# Patient Record
Sex: Female | Born: 1945 | Race: White | Hispanic: No | Smoking: Former smoker
Health system: Southern US, Community
[De-identification: ages and names within clinical notes are randomized; demographics above are authoritative.]

## PROBLEM LIST (undated history)

## (undated) DIAGNOSIS — J449 Chronic obstructive pulmonary disease, unspecified: Secondary | ICD-10-CM

## (undated) DIAGNOSIS — E063 Autoimmune thyroiditis: Secondary | ICD-10-CM

## (undated) DIAGNOSIS — I471 Supraventricular tachycardia, unspecified: Secondary | ICD-10-CM

## (undated) DIAGNOSIS — G459 Transient cerebral ischemic attack, unspecified: Secondary | ICD-10-CM

## (undated) DIAGNOSIS — Z5189 Encounter for other specified aftercare: Secondary | ICD-10-CM

## (undated) DIAGNOSIS — D849 Immunodeficiency, unspecified: Secondary | ICD-10-CM

## (undated) DIAGNOSIS — J189 Pneumonia, unspecified organism: Secondary | ICD-10-CM

## (undated) DIAGNOSIS — M1990 Unspecified osteoarthritis, unspecified site: Secondary | ICD-10-CM

## (undated) HISTORY — PX: CHOLECYSTECTOMY: SHX55

## (undated) HISTORY — PX: TONSILLECTOMY: SUR1361

## (undated) HISTORY — PX: ABDOMINAL HYSTERECTOMY: SHX81

## (undated) HISTORY — DX: Supraventricular tachycardia, unspecified: I47.10

## (undated) HISTORY — DX: Supraventricular tachycardia: I47.1

## (undated) HISTORY — PX: APPENDECTOMY: SHX54

## (undated) HISTORY — PX: CATARACT EXTRACTION: SUR2

---

## 2012-02-03 DIAGNOSIS — R9389 Abnormal findings on diagnostic imaging of other specified body structures: Secondary | ICD-10-CM

## 2012-02-03 DIAGNOSIS — R0602 Shortness of breath: Secondary | ICD-10-CM

## 2012-02-03 HISTORY — DX: Abnormal findings on diagnostic imaging of other specified body structures: R93.89

## 2012-02-03 HISTORY — DX: Shortness of breath: R06.02

## 2012-09-08 DIAGNOSIS — I341 Nonrheumatic mitral (valve) prolapse: Secondary | ICD-10-CM | POA: Insufficient documentation

## 2012-09-08 DIAGNOSIS — G459 Transient cerebral ischemic attack, unspecified: Secondary | ICD-10-CM

## 2012-09-08 DIAGNOSIS — G43119 Migraine with aura, intractable, without status migrainosus: Secondary | ICD-10-CM | POA: Insufficient documentation

## 2012-09-08 DIAGNOSIS — A310 Pulmonary mycobacterial infection: Secondary | ICD-10-CM

## 2012-09-08 DIAGNOSIS — R413 Other amnesia: Secondary | ICD-10-CM

## 2012-09-08 HISTORY — DX: Transient cerebral ischemic attack, unspecified: G45.9

## 2012-09-08 HISTORY — DX: Migraine with aura, intractable, without status migrainosus: G43.119

## 2012-09-08 HISTORY — DX: Other amnesia: R41.3

## 2012-09-08 HISTORY — DX: Nonrheumatic mitral (valve) prolapse: I34.1

## 2012-09-08 HISTORY — DX: Pulmonary mycobacterial infection: A31.0

## 2013-11-30 DIAGNOSIS — A419 Sepsis, unspecified organism: Secondary | ICD-10-CM

## 2013-11-30 HISTORY — DX: Sepsis, unspecified organism: A41.9

## 2013-12-21 DIAGNOSIS — I679 Cerebrovascular disease, unspecified: Secondary | ICD-10-CM

## 2013-12-21 DIAGNOSIS — L659 Nonscarring hair loss, unspecified: Secondary | ICD-10-CM

## 2013-12-21 DIAGNOSIS — I6782 Cerebral ischemia: Secondary | ICD-10-CM | POA: Insufficient documentation

## 2013-12-21 DIAGNOSIS — R5381 Other malaise: Secondary | ICD-10-CM

## 2013-12-21 DIAGNOSIS — I519 Heart disease, unspecified: Secondary | ICD-10-CM

## 2013-12-21 DIAGNOSIS — Z79899 Other long term (current) drug therapy: Secondary | ICD-10-CM

## 2013-12-21 DIAGNOSIS — J90 Pleural effusion, not elsewhere classified: Secondary | ICD-10-CM | POA: Insufficient documentation

## 2013-12-21 DIAGNOSIS — M1710 Unilateral primary osteoarthritis, unspecified knee: Secondary | ICD-10-CM

## 2013-12-21 DIAGNOSIS — N951 Menopausal and female climacteric states: Secondary | ICD-10-CM

## 2013-12-21 DIAGNOSIS — F17200 Nicotine dependence, unspecified, uncomplicated: Secondary | ICD-10-CM

## 2013-12-21 DIAGNOSIS — I951 Orthostatic hypotension: Secondary | ICD-10-CM | POA: Insufficient documentation

## 2013-12-21 DIAGNOSIS — R0902 Hypoxemia: Secondary | ICD-10-CM | POA: Insufficient documentation

## 2013-12-21 DIAGNOSIS — F4320 Adjustment disorder, unspecified: Secondary | ICD-10-CM

## 2013-12-21 DIAGNOSIS — R519 Headache, unspecified: Secondary | ICD-10-CM

## 2013-12-21 DIAGNOSIS — K589 Irritable bowel syndrome without diarrhea: Secondary | ICD-10-CM

## 2013-12-21 DIAGNOSIS — J984 Other disorders of lung: Secondary | ICD-10-CM

## 2013-12-21 DIAGNOSIS — M2240 Chondromalacia patellae, unspecified knee: Secondary | ICD-10-CM

## 2013-12-21 DIAGNOSIS — I368 Other nonrheumatic tricuspid valve disorders: Secondary | ICD-10-CM

## 2013-12-21 DIAGNOSIS — E875 Hyperkalemia: Secondary | ICD-10-CM

## 2013-12-21 DIAGNOSIS — G5760 Lesion of plantar nerve, unspecified lower limb: Secondary | ICD-10-CM

## 2013-12-21 DIAGNOSIS — K219 Gastro-esophageal reflux disease without esophagitis: Secondary | ICD-10-CM

## 2013-12-21 DIAGNOSIS — E039 Hypothyroidism, unspecified: Secondary | ICD-10-CM

## 2013-12-21 DIAGNOSIS — F988 Other specified behavioral and emotional disorders with onset usually occurring in childhood and adolescence: Secondary | ICD-10-CM

## 2013-12-21 DIAGNOSIS — R404 Transient alteration of awareness: Secondary | ICD-10-CM

## 2013-12-21 DIAGNOSIS — M26629 Arthralgia of temporomandibular joint, unspecified side: Secondary | ICD-10-CM

## 2013-12-21 HISTORY — DX: Hypoxemia: R09.02

## 2013-12-21 HISTORY — DX: Nicotine dependence, unspecified, uncomplicated: F17.200

## 2013-12-21 HISTORY — DX: Other malaise: R53.81

## 2013-12-21 HISTORY — DX: Chondromalacia patellae, unspecified knee: M22.40

## 2013-12-21 HISTORY — DX: Heart disease, unspecified: I51.9

## 2013-12-21 HISTORY — DX: Other long term (current) drug therapy: Z79.899

## 2013-12-21 HISTORY — DX: Nonscarring hair loss, unspecified: L65.9

## 2013-12-21 HISTORY — DX: Lesion of plantar nerve, unspecified lower limb: G57.60

## 2013-12-21 HISTORY — DX: Arthralgia of temporomandibular joint, unspecified side: M26.629

## 2013-12-21 HISTORY — DX: Adjustment disorder, unspecified: F43.20

## 2013-12-21 HISTORY — DX: Cerebrovascular disease, unspecified: I67.9

## 2013-12-21 HISTORY — DX: Hyperkalemia: E87.5

## 2013-12-21 HISTORY — DX: Gastro-esophageal reflux disease without esophagitis: K21.9

## 2013-12-21 HISTORY — DX: Menopausal and female climacteric states: N95.1

## 2013-12-21 HISTORY — DX: Transient alteration of awareness: R40.4

## 2013-12-21 HISTORY — DX: Unilateral primary osteoarthritis, unspecified knee: M17.10

## 2013-12-21 HISTORY — DX: Pleural effusion, not elsewhere classified: J90

## 2013-12-21 HISTORY — DX: Cerebral ischemia: I67.82

## 2013-12-21 HISTORY — DX: Other disorders of lung: J98.4

## 2013-12-21 HISTORY — DX: Irritable bowel syndrome, unspecified: K58.9

## 2013-12-21 HISTORY — DX: Orthostatic hypotension: I95.1

## 2013-12-21 HISTORY — DX: Headache, unspecified: R51.9

## 2013-12-21 HISTORY — DX: Other nonrheumatic tricuspid valve disorders: I36.8

## 2013-12-21 HISTORY — DX: Hypothyroidism, unspecified: E03.9

## 2013-12-21 HISTORY — DX: Other specified behavioral and emotional disorders with onset usually occurring in childhood and adolescence: F98.8

## 2013-12-27 DIAGNOSIS — R918 Other nonspecific abnormal finding of lung field: Secondary | ICD-10-CM | POA: Insufficient documentation

## 2013-12-27 HISTORY — DX: Other nonspecific abnormal finding of lung field: R91.8

## 2013-12-29 DIAGNOSIS — D510 Vitamin B12 deficiency anemia due to intrinsic factor deficiency: Secondary | ICD-10-CM | POA: Insufficient documentation

## 2013-12-29 DIAGNOSIS — E876 Hypokalemia: Secondary | ICD-10-CM

## 2013-12-29 DIAGNOSIS — I4891 Unspecified atrial fibrillation: Secondary | ICD-10-CM

## 2013-12-29 DIAGNOSIS — I480 Paroxysmal atrial fibrillation: Secondary | ICD-10-CM | POA: Insufficient documentation

## 2013-12-29 HISTORY — DX: Vitamin B12 deficiency anemia due to intrinsic factor deficiency: D51.0

## 2013-12-29 HISTORY — DX: Unspecified atrial fibrillation: I48.91

## 2013-12-29 HISTORY — DX: Hypokalemia: E87.6

## 2013-12-29 HISTORY — DX: Paroxysmal atrial fibrillation: I48.0

## 2014-02-02 DIAGNOSIS — R600 Localized edema: Secondary | ICD-10-CM | POA: Insufficient documentation

## 2014-02-02 HISTORY — DX: Localized edema: R60.0

## 2014-02-14 DIAGNOSIS — M722 Plantar fascial fibromatosis: Secondary | ICD-10-CM

## 2014-02-14 DIAGNOSIS — M19079 Primary osteoarthritis, unspecified ankle and foot: Secondary | ICD-10-CM | POA: Insufficient documentation

## 2014-02-14 HISTORY — DX: Plantar fascial fibromatosis: M72.2

## 2014-02-14 HISTORY — DX: Primary osteoarthritis, unspecified ankle and foot: M19.079

## 2014-06-05 DIAGNOSIS — J45901 Unspecified asthma with (acute) exacerbation: Secondary | ICD-10-CM | POA: Insufficient documentation

## 2014-06-05 HISTORY — DX: Unspecified asthma with (acute) exacerbation: J45.901

## 2014-06-30 DIAGNOSIS — I8002 Phlebitis and thrombophlebitis of superficial vessels of left lower extremity: Secondary | ICD-10-CM

## 2014-06-30 HISTORY — DX: Phlebitis and thrombophlebitis of superficial vessels of left lower extremity: I80.02

## 2014-08-15 DIAGNOSIS — R0682 Tachypnea, not elsewhere classified: Secondary | ICD-10-CM

## 2014-08-15 HISTORY — DX: Tachypnea, not elsewhere classified: R06.82

## 2014-08-28 DIAGNOSIS — S8002XA Contusion of left knee, initial encounter: Secondary | ICD-10-CM | POA: Insufficient documentation

## 2014-08-28 HISTORY — DX: Contusion of left knee, initial encounter: S80.02XA

## 2014-09-26 DIAGNOSIS — L02531 Carbuncle of right hand: Secondary | ICD-10-CM

## 2014-09-26 HISTORY — DX: Carbuncle of right hand: L02.531

## 2014-11-01 DIAGNOSIS — Z90710 Acquired absence of both cervix and uterus: Secondary | ICD-10-CM | POA: Insufficient documentation

## 2014-11-01 HISTORY — DX: Acquired absence of both cervix and uterus: Z90.710

## 2014-11-22 DIAGNOSIS — J45909 Unspecified asthma, uncomplicated: Secondary | ICD-10-CM

## 2014-11-22 HISTORY — DX: Unspecified asthma, uncomplicated: J45.909

## 2014-11-29 DIAGNOSIS — D509 Iron deficiency anemia, unspecified: Secondary | ICD-10-CM | POA: Insufficient documentation

## 2014-11-29 HISTORY — DX: Iron deficiency anemia, unspecified: D50.9

## 2015-02-14 DIAGNOSIS — R42 Dizziness and giddiness: Secondary | ICD-10-CM

## 2015-02-14 DIAGNOSIS — R519 Headache, unspecified: Secondary | ICD-10-CM

## 2015-02-14 HISTORY — DX: Dizziness and giddiness: R42

## 2015-02-14 HISTORY — DX: Headache, unspecified: R51.9

## 2015-02-28 DIAGNOSIS — J189 Pneumonia, unspecified organism: Secondary | ICD-10-CM

## 2015-02-28 DIAGNOSIS — S46919A Strain of unspecified muscle, fascia and tendon at shoulder and upper arm level, unspecified arm, initial encounter: Secondary | ICD-10-CM | POA: Insufficient documentation

## 2015-02-28 HISTORY — DX: Pneumonia, unspecified organism: J18.9

## 2015-02-28 HISTORY — DX: Strain of unspecified muscle, fascia and tendon at shoulder and upper arm level, unspecified arm, initial encounter: S46.919A

## 2015-03-02 DIAGNOSIS — D801 Nonfamilial hypogammaglobulinemia: Secondary | ICD-10-CM

## 2015-03-02 HISTORY — DX: Nonfamilial hypogammaglobulinemia: D80.1

## 2015-03-14 DIAGNOSIS — D839 Common variable immunodeficiency, unspecified: Secondary | ICD-10-CM

## 2015-03-14 HISTORY — DX: Common variable immunodeficiency, unspecified: D83.9

## 2015-03-28 DIAGNOSIS — R634 Abnormal weight loss: Secondary | ICD-10-CM

## 2015-03-28 DIAGNOSIS — R112 Nausea with vomiting, unspecified: Secondary | ICD-10-CM | POA: Insufficient documentation

## 2015-03-28 HISTORY — DX: Nausea with vomiting, unspecified: R11.2

## 2015-03-28 HISTORY — DX: Abnormal weight loss: R63.4

## 2015-04-25 DIAGNOSIS — D806 Antibody deficiency with near-normal immunoglobulins or with hyperimmunoglobulinemia: Secondary | ICD-10-CM

## 2015-04-25 HISTORY — DX: Antibody deficiency with near-normal immunoglobulins or with hyperimmunoglobulinemia: D80.6

## 2015-06-17 ENCOUNTER — Encounter (HOSPITAL_BASED_OUTPATIENT_CLINIC_OR_DEPARTMENT_OTHER): Payer: Self-pay | Admitting: *Deleted

## 2015-06-17 ENCOUNTER — Emergency Department (HOSPITAL_BASED_OUTPATIENT_CLINIC_OR_DEPARTMENT_OTHER): Payer: Medicare Other

## 2015-06-17 ENCOUNTER — Emergency Department (HOSPITAL_BASED_OUTPATIENT_CLINIC_OR_DEPARTMENT_OTHER)
Admission: EM | Admit: 2015-06-17 | Discharge: 2015-06-17 | Disposition: A | Discharge status: Home / Self Care | Payer: Medicare Other | Attending: Emergency Medicine | Admitting: Emergency Medicine

## 2015-06-17 DIAGNOSIS — S80211A Abrasion, right knee, initial encounter: Secondary | ICD-10-CM | POA: Insufficient documentation

## 2015-06-17 DIAGNOSIS — Y998 Other external cause status: Secondary | ICD-10-CM | POA: Diagnosis not present

## 2015-06-17 DIAGNOSIS — S0990XA Unspecified injury of head, initial encounter: Secondary | ICD-10-CM | POA: Diagnosis present

## 2015-06-17 DIAGNOSIS — M1990 Unspecified osteoarthritis, unspecified site: Secondary | ICD-10-CM | POA: Insufficient documentation

## 2015-06-17 DIAGNOSIS — Z8673 Personal history of transient ischemic attack (TIA), and cerebral infarction without residual deficits: Secondary | ICD-10-CM | POA: Diagnosis not present

## 2015-06-17 DIAGNOSIS — W01198A Fall on same level from slipping, tripping and stumbling with subsequent striking against other object, initial encounter: Secondary | ICD-10-CM | POA: Insufficient documentation

## 2015-06-17 DIAGNOSIS — S60032A Contusion of left middle finger without damage to nail, initial encounter: Secondary | ICD-10-CM | POA: Insufficient documentation

## 2015-06-17 DIAGNOSIS — S60021A Contusion of right index finger without damage to nail, initial encounter: Secondary | ICD-10-CM | POA: Insufficient documentation

## 2015-06-17 DIAGNOSIS — Z8701 Personal history of pneumonia (recurrent): Secondary | ICD-10-CM | POA: Insufficient documentation

## 2015-06-17 DIAGNOSIS — Z79899 Other long term (current) drug therapy: Secondary | ICD-10-CM | POA: Insufficient documentation

## 2015-06-17 DIAGNOSIS — S0181XA Laceration without foreign body of other part of head, initial encounter: Secondary | ICD-10-CM | POA: Insufficient documentation

## 2015-06-17 DIAGNOSIS — S80212A Abrasion, left knee, initial encounter: Secondary | ICD-10-CM | POA: Insufficient documentation

## 2015-06-17 DIAGNOSIS — J449 Chronic obstructive pulmonary disease, unspecified: Secondary | ICD-10-CM | POA: Diagnosis not present

## 2015-06-17 DIAGNOSIS — Z87891 Personal history of nicotine dependence: Secondary | ICD-10-CM | POA: Diagnosis not present

## 2015-06-17 DIAGNOSIS — Y9301 Activity, walking, marching and hiking: Secondary | ICD-10-CM | POA: Diagnosis not present

## 2015-06-17 DIAGNOSIS — I4891 Unspecified atrial fibrillation: Secondary | ICD-10-CM | POA: Diagnosis not present

## 2015-06-17 DIAGNOSIS — Y92481 Parking lot as the place of occurrence of the external cause: Secondary | ICD-10-CM | POA: Diagnosis not present

## 2015-06-17 DIAGNOSIS — W19XXXA Unspecified fall, initial encounter: Secondary | ICD-10-CM

## 2015-06-17 HISTORY — DX: Encounter for other specified aftercare: Z51.89

## 2015-06-17 HISTORY — DX: Pneumonia, unspecified organism: J18.9

## 2015-06-17 HISTORY — DX: Unspecified osteoarthritis, unspecified site: M19.90

## 2015-06-17 HISTORY — DX: Chronic obstructive pulmonary disease, unspecified: J44.9

## 2015-06-17 HISTORY — DX: Autoimmune thyroiditis: E06.3

## 2015-06-17 HISTORY — DX: Immunodeficiency, unspecified: D84.9

## 2015-06-17 HISTORY — DX: Transient cerebral ischemic attack, unspecified: G45.9

## 2015-06-17 MED ORDER — ACETAMINOPHEN 325 MG PO TABS
650.0000 mg | ORAL_TABLET | Freq: Once | ORAL | Status: AC
  Administered 2015-06-17: 650 mg via ORAL
  Filled 2015-06-17: qty 2

## 2015-06-17 NOTE — ED Notes (Signed)
Pt able to ambulate around nurses station without difficulty - steady gait. EDP made aware.

## 2015-06-17 NOTE — ED Notes (Addendum)
Pt's daughter concern that pr might have head injury due to previous TIAs. Pt reports pian in the back of her head. Pt reports that she had her hands full with books , tripped over the concret bumper, landed on rt knee and hands. Pt's daughter reports that pt hit her head.  Laceration to rt knee , swollen index finger to lt hand over the knuckle, small laceration on the inner aspect of middle finger and small less than 1 cm laceration to lower chin. Daughter placed Tegaderm over chin and rt knee abrasion. Tegaderm removed on both areas. All areas cleaned with sterile saline, no active bleeding at present time. Wet to dry 4x4 placed over rt knee and around lt hand middle finger. Warm blankets given to patient and Dr. Winfred Leeds made aware of pt's current conditions and daughter's concerns.

## 2015-06-17 NOTE — ED Notes (Signed)
Reports she tripped in parking lot and fell- hit forehead and chin (chin is bleeding); bruising and abrasions noted to both knees and both hands. Denies LOC

## 2015-06-17 NOTE — ED Provider Notes (Addendum)
CSN: [id number]     Arrival date & time 06/17/15  1303 History   First MD Initiated Contact with Patient 06/17/15 1340     Chief Complaint  Patient presents with  . Fall     (Consider location/radiation/quality/duration/timing/severity/associated sxs/prior Treatment) HPI Patient was feeling well until she tripped and fell while walking today at 11:50 AM. She struck her face. She also struck both hands and both knees as result of fall. She complains of pain at her chin, occipital headache bilateral hand pain and bilateral knee pain since the event. She was able to walk after the event. She did not lose consciousness. No other associated symptoms. No treatment prior to coming here. Also suffered laceration to chin is resolving event. Pain is mild at present. Pain in hands are worse with movement or pressing on the area. Improved with remaining still. Past Medical History  Diagnosis Date  . A-fib ([place])   . Immune deficiency disorder ([place])   . Pneumonia   . Hashimoto's disease   . Blood transfusion without reported diagnosis   . Arthritis   . COPD (chronic obstructive pulmonary disease) ([place])   . TIA (transient ischemic attack)    Past Surgical History  Procedure Laterality Date  . Abdominal hysterectomy    . Tonsillectomy    . Cholecystectomy    . Appendectomy    . Cataract extraction Bilateral    thyroidectomy No family history on file. Social History  Substance Use Topics  . Smoking status: Former Research scientist (life sciences)  . Smokeless tobacco: Never Used  . Alcohol Use: No   OB History    No data available     Review of Systems  Constitutional: Negative.   Respiratory: Negative.   Cardiovascular: Negative.   Gastrointestinal: Negative.   Musculoskeletal: Positive for arthralgias.       Bilateral hand pain, bilateral knee pain  Skin: Positive for wound.       Laceration to chin abrasions to both knees  Allergic/Immunologic:       Up to date on tetanus immunization  Neurological:  Positive for headaches.       Memory problems since prior TIAs  Psychiatric/Behavioral: Negative.   All other systems reviewed and are negative.     Allergies  Ciprofloxacin; Macrolides and ketolides; and Sulfa antibiotics  Home Medications   Prior to Admission medications   Medication Sig Start Date End Date Taking? Authorizing Provider  ALPRAZolam Duanne Moron) 1 MG tablet Take 1 mg by mouth as needed for anxiety.   Yes Historical Provider, MD  cefUROXime (CEFTIN) 250 MG tablet Take 250 mg by mouth 2 (two) times daily with a meal.   Yes Historical Provider, MD  diltiazem (DILACOR XR) 180 MG 24 hr capsule Take 180 mg by mouth daily.   Yes Historical Provider, MD  estradiol (ESTRACE) 0.5 MG tablet Take 0.5 mg by mouth daily.   Yes Historical Provider, MD  HYDROcodone-acetaminophen (NORCO) 10-325 MG tablet Take 1 tablet by mouth every 6 (six) hours as needed (PRN for headaches).   Yes Historical Provider, MD  levothyroxine (SYNTHROID, LEVOTHROID) 125 MCG tablet Take 125 mcg by mouth daily before breakfast.   Yes Historical Provider, MD   BP 171/76 mmHg  Pulse 76  Temp(Src) 98.3 F (36.8 C) (Oral)  Resp 20  Ht 5' 7.5" (1.715 m)  Wt 125 lb (56.7 kg)  BMI 19.28 kg/m2  SpO2 97% Physical Exam  Constitutional: She is oriented to person, place, and time. She appears well-developed and well-nourished. No  distress.  Alert Glasgow Coma Score 15  HENT:  1 cm laceration at chin. No active bleeding no hematoma no trismus no bony tenderness no malocclusion teeth. Bilateral tympanic membranes normal otherwise normocephalic atraumatic  Eyes: Conjunctivae are normal. Pupils are equal, round, and reactive to light.  Neck: Neck supple. No JVD present. No tracheal deviation present. No thyromegaly present.  Surgical scar anteriorly. Mild diffuse tenderness posteriorly no bruit no JVD  Cardiovascular: Normal rate.   No murmur heard. Pulmonary/Chest: Effort normal and breath sounds normal. She exhibits  no tenderness.  Abdominal: Soft. Bowel sounds are normal. She exhibits no distension. There is no tenderness.  Musculoskeletal: Normal range of motion. She exhibits no edema or tenderness.  Pelvis stable nontender. Thoracic and lumbar spines nontender. Left upper extremity 3 cm hematoma over dorsal aspect of MCP joint middle finger with corresponding tenderness. Full range of motion otherwise atraumatic. Neurovascular intact. Right upper extremity 3 cm hematoma over dorsal aspect of MCP joint index finger with corresponding tenderness. Full range of motion. Neurovascular intact. Bilateral lower extremities with abrasions over her anterior knees with corresponding tenderness no obvious deformity. Neurovascularly intact.  Neurological: She is alert and oriented to person, place, and time. No cranial nerve deficit. Coordination normal.  Moves all extremity is well cranial nerves II through XII grossly intact motor strength 5 over 5 overall  Skin: Skin is warm and dry. No rash noted.  Psychiatric: She has a normal mood and affect.  Nursing note and vitals reviewed.   ED Course  Procedures (including critical care time) Labs Review Labs Reviewed - No data to display  Imaging Review No results found. I have personally reviewed and evaluated these images and lab results as part of my medical decision-making.   EKG Interpretation None     X-rays viewed by me  3:15 PM she is alert and ambulatory without difficulty. Posterior to go home after treatment with Tylenol MDM  CT scan of brain and cervical spine ordered due to mechanism of injury, headache and cervical spine tenderness in this elderly patient . Chin laceration does not require repair. Topical antibiotics applied to chin laceration and 2 abrasions of knees after local wound care.  Final diagnoses:  None  Plan local wound care home observation diagnosis #1 fall #3  1 cm chin laceration #4 abrasions to bilateral knees #67minor closed  head trauma #6 contusions to both hands     Orlie Dakin, MD 06/17/15 1621  Orlie Dakin, MD 06/17/15 1621

## 2015-06-17 NOTE — Discharge Instructions (Signed)
Wash wounds daily with soap and water and place a thin layer of bacitracin ointment over the wounds and cover with a sterile bandage. Signs of infection including redness around the wounds, drainage for the wounds, more pain or fever. Return for any signs of infection or concern. Take Tylenol for mild pain or your hydrocodone prescribed for bad pain. Don't take Tylenol together with hydrocodone as the combination can be dangerous to the liver. Don't take hydrocodone together with Xanax as the combination is dangerous and can interfere with breathing. Return if you feel worse for any reason or see her primary care physician.

## 2015-07-18 DIAGNOSIS — M7041 Prepatellar bursitis, right knee: Secondary | ICD-10-CM | POA: Insufficient documentation

## 2015-07-18 HISTORY — DX: Prepatellar bursitis, right knee: M70.41

## 2015-12-03 DIAGNOSIS — G4719 Other hypersomnia: Secondary | ICD-10-CM | POA: Insufficient documentation

## 2015-12-03 HISTORY — DX: Other hypersomnia: G47.19

## 2015-12-26 DIAGNOSIS — R091 Pleurisy: Secondary | ICD-10-CM

## 2015-12-26 HISTORY — DX: Pleurisy: R09.1

## 2016-01-02 DIAGNOSIS — J4530 Mild persistent asthma, uncomplicated: Secondary | ICD-10-CM

## 2016-01-02 HISTORY — DX: Mild persistent asthma, uncomplicated: J45.30

## 2016-03-20 DIAGNOSIS — R140 Abdominal distension (gaseous): Secondary | ICD-10-CM

## 2016-03-20 DIAGNOSIS — S8012XA Contusion of left lower leg, initial encounter: Secondary | ICD-10-CM | POA: Insufficient documentation

## 2016-03-20 DIAGNOSIS — I809 Phlebitis and thrombophlebitis of unspecified site: Secondary | ICD-10-CM

## 2016-03-20 DIAGNOSIS — K529 Noninfective gastroenteritis and colitis, unspecified: Secondary | ICD-10-CM | POA: Insufficient documentation

## 2016-03-20 DIAGNOSIS — J069 Acute upper respiratory infection, unspecified: Secondary | ICD-10-CM | POA: Insufficient documentation

## 2016-03-20 HISTORY — DX: Noninfective gastroenteritis and colitis, unspecified: K52.9

## 2016-03-20 HISTORY — DX: Contusion of left lower leg, initial encounter: S80.12XA

## 2016-03-20 HISTORY — DX: Phlebitis and thrombophlebitis of unspecified site: I80.9

## 2016-03-20 HISTORY — DX: Acute upper respiratory infection, unspecified: J06.9

## 2016-03-20 HISTORY — DX: Abdominal distension (gaseous): R14.0

## 2016-08-11 DIAGNOSIS — H6503 Acute serous otitis media, bilateral: Secondary | ICD-10-CM | POA: Insufficient documentation

## 2016-08-11 DIAGNOSIS — Z1159 Encounter for screening for other viral diseases: Secondary | ICD-10-CM | POA: Insufficient documentation

## 2016-08-11 HISTORY — DX: Acute serous otitis media, bilateral: H65.03

## 2016-08-11 HISTORY — DX: Encounter for screening for other viral diseases: Z11.59

## 2016-08-20 DIAGNOSIS — J208 Acute bronchitis due to other specified organisms: Secondary | ICD-10-CM

## 2016-08-20 DIAGNOSIS — M940 Chondrocostal junction syndrome [Tietze]: Secondary | ICD-10-CM

## 2016-08-20 HISTORY — DX: Acute bronchitis due to other specified organisms: J20.8

## 2016-08-20 HISTORY — DX: Chondrocostal junction syndrome (tietze): M94.0

## 2016-10-21 IMAGING — DX DG KNEE COMPLETE 4+V*R*
4 series · 4 of 4 positions shown · non-contrast
Comparison: None.

CLINICAL DATA: Status post fall on cement today with a right knee
injury. Pain. Initial encounter.

EXAM:
RIGHT KNEE - COMPLETE 4+ VIEW

[knee ap]
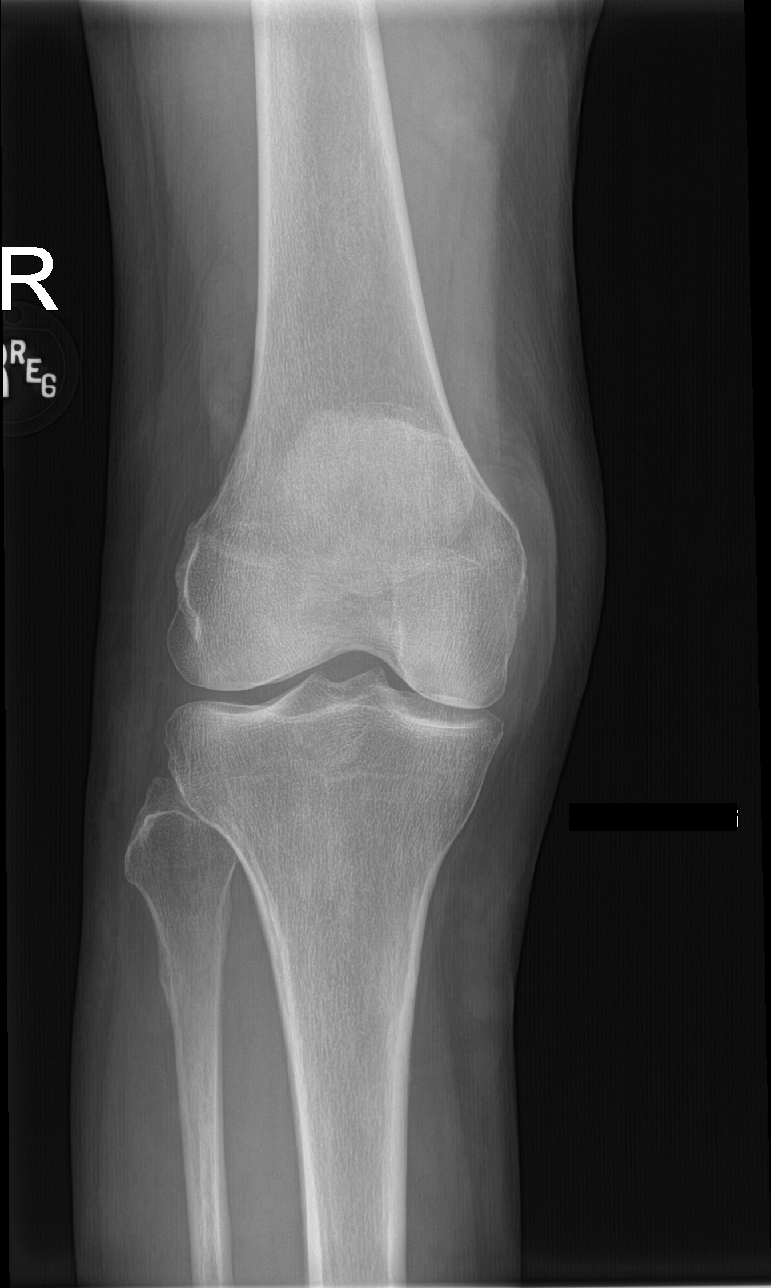

[tunnel]
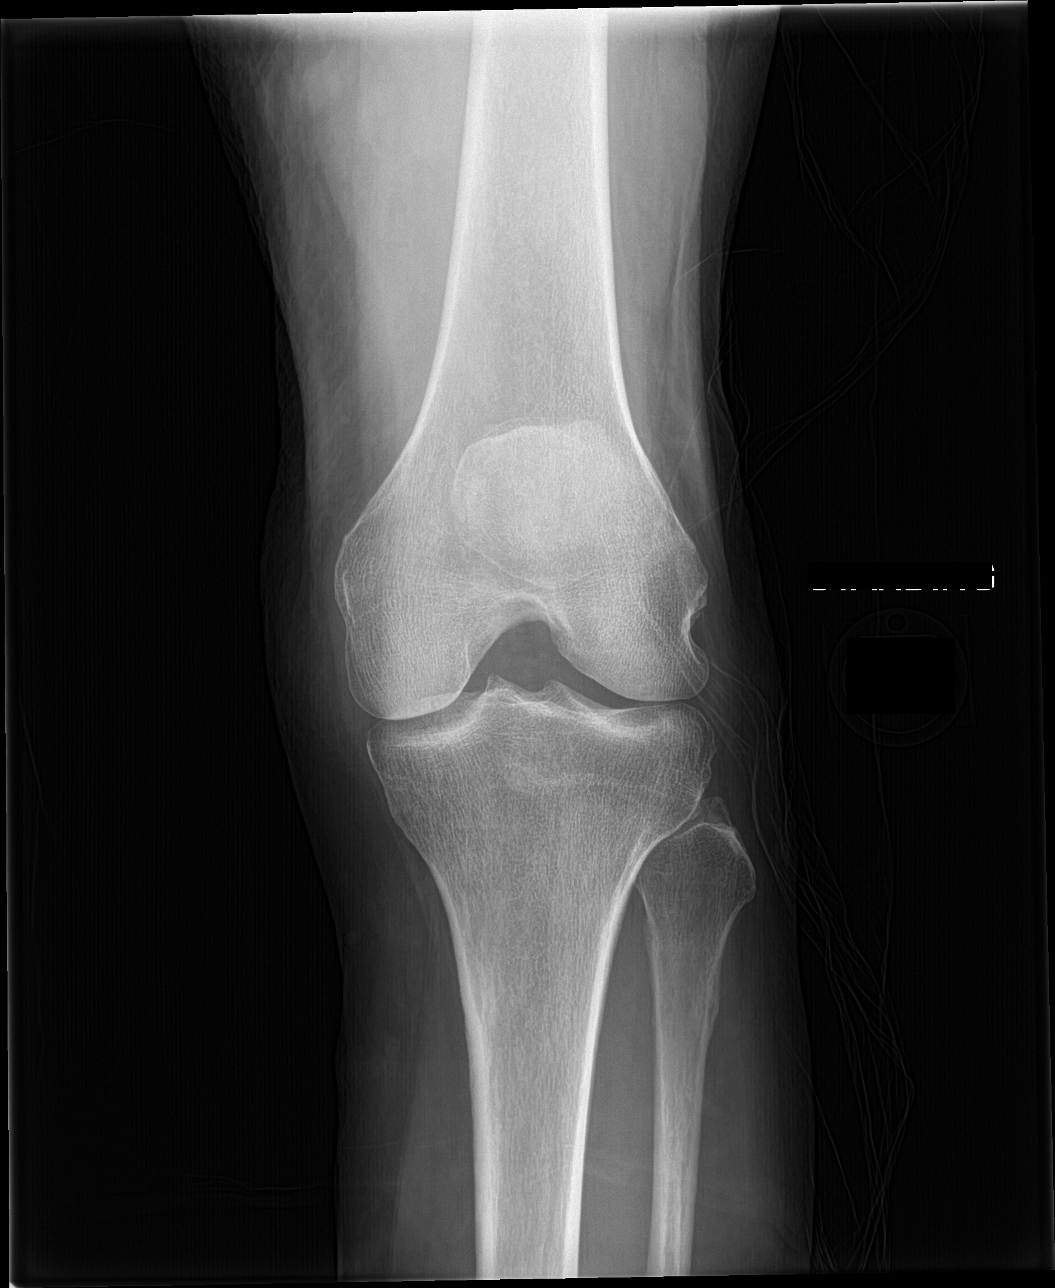

[knee lat]
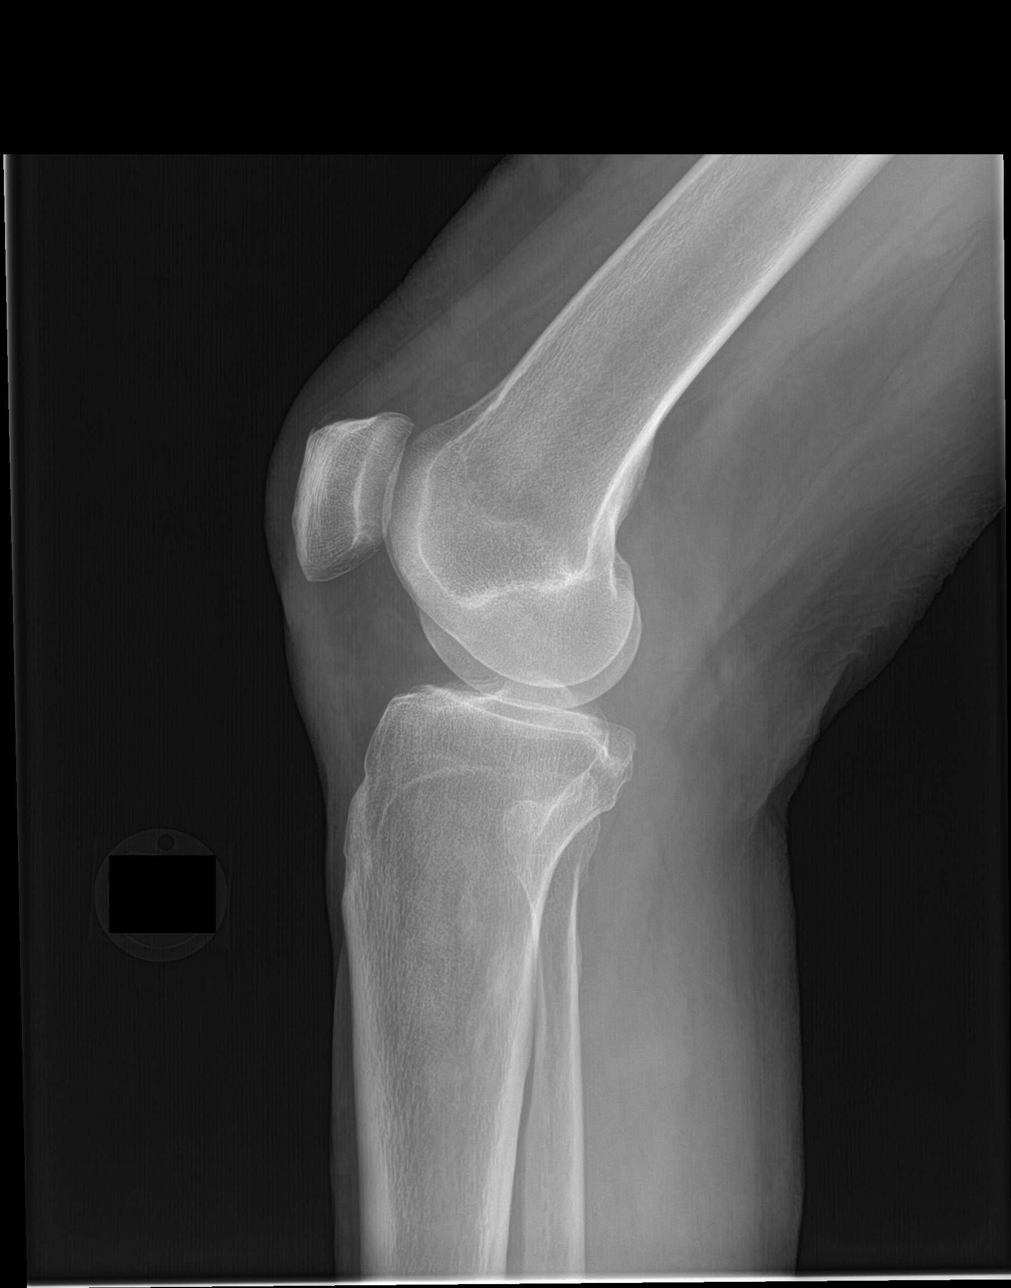

[knee sunrise]
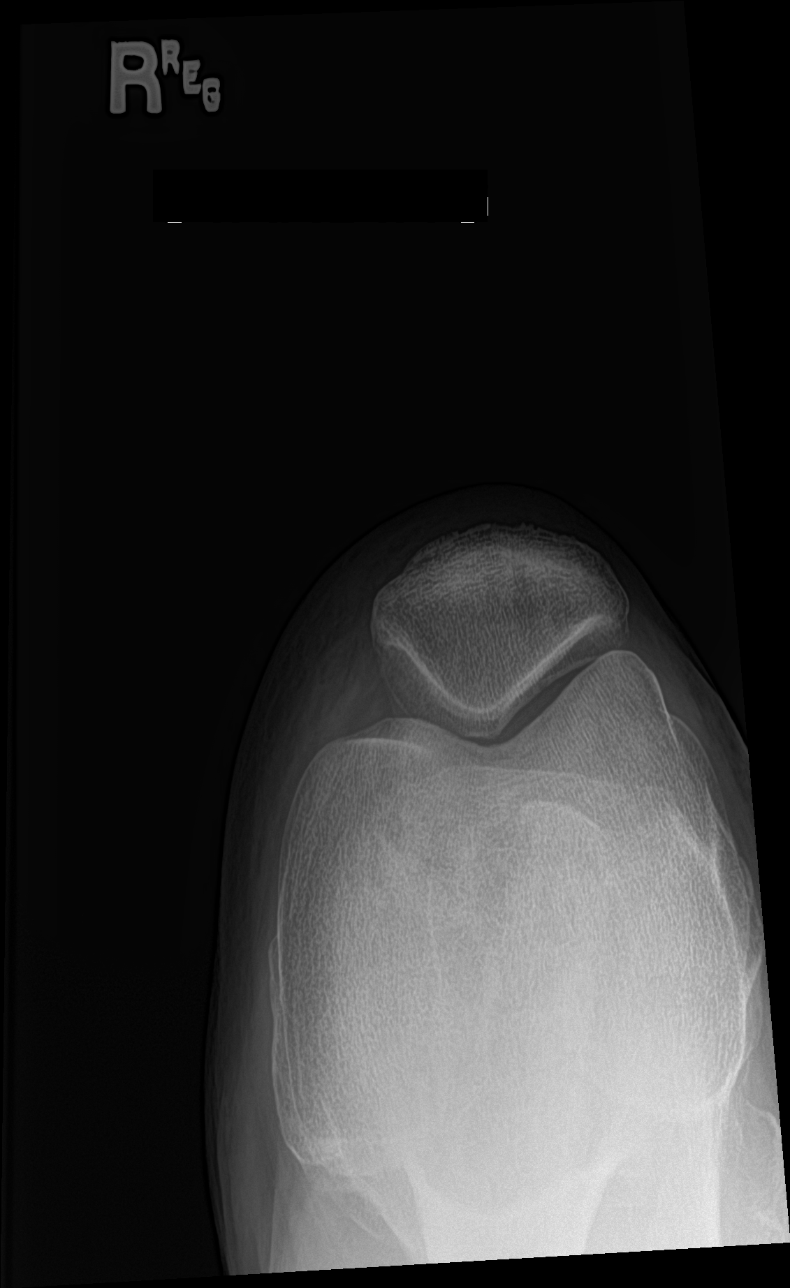

[4 of 4 positions shown; findings below may reference images not displayed]

FINDINGS: There is no evidence of fracture, dislocation, or joint effusion.
There is no evidence of arthropathy or other focal bone abnormality.
Soft tissues are unremarkable.
IMPRESSION: Negative exam.

## 2016-10-21 IMAGING — DX DG HAND COMPLETE 3+V*R*
3 series · 3 of 3 positions shown · non-contrast
Comparison: None.

CLINICAL DATA: Fall today onto cement. Posterior right third MCP
joint region abrasion. Initial encounter.

EXAM:
RIGHT HAND - COMPLETE 3+ VIEW

[hand pa]
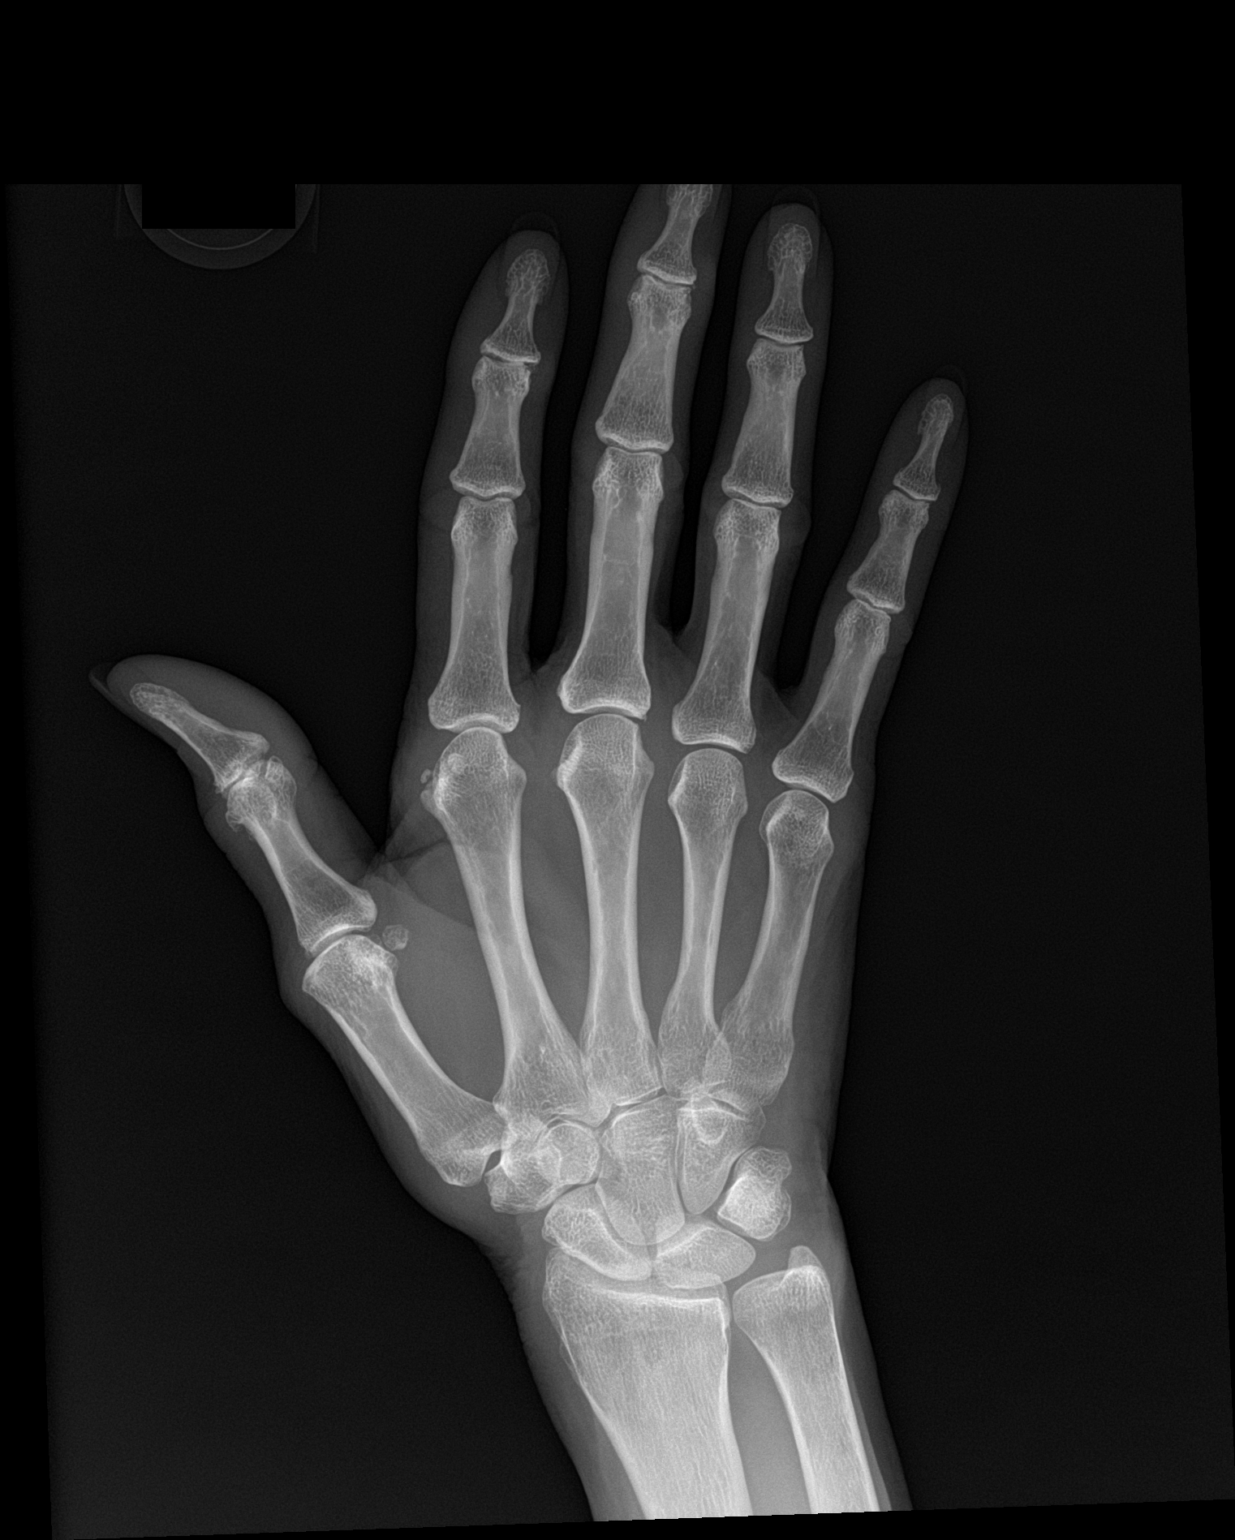

[hand obl]
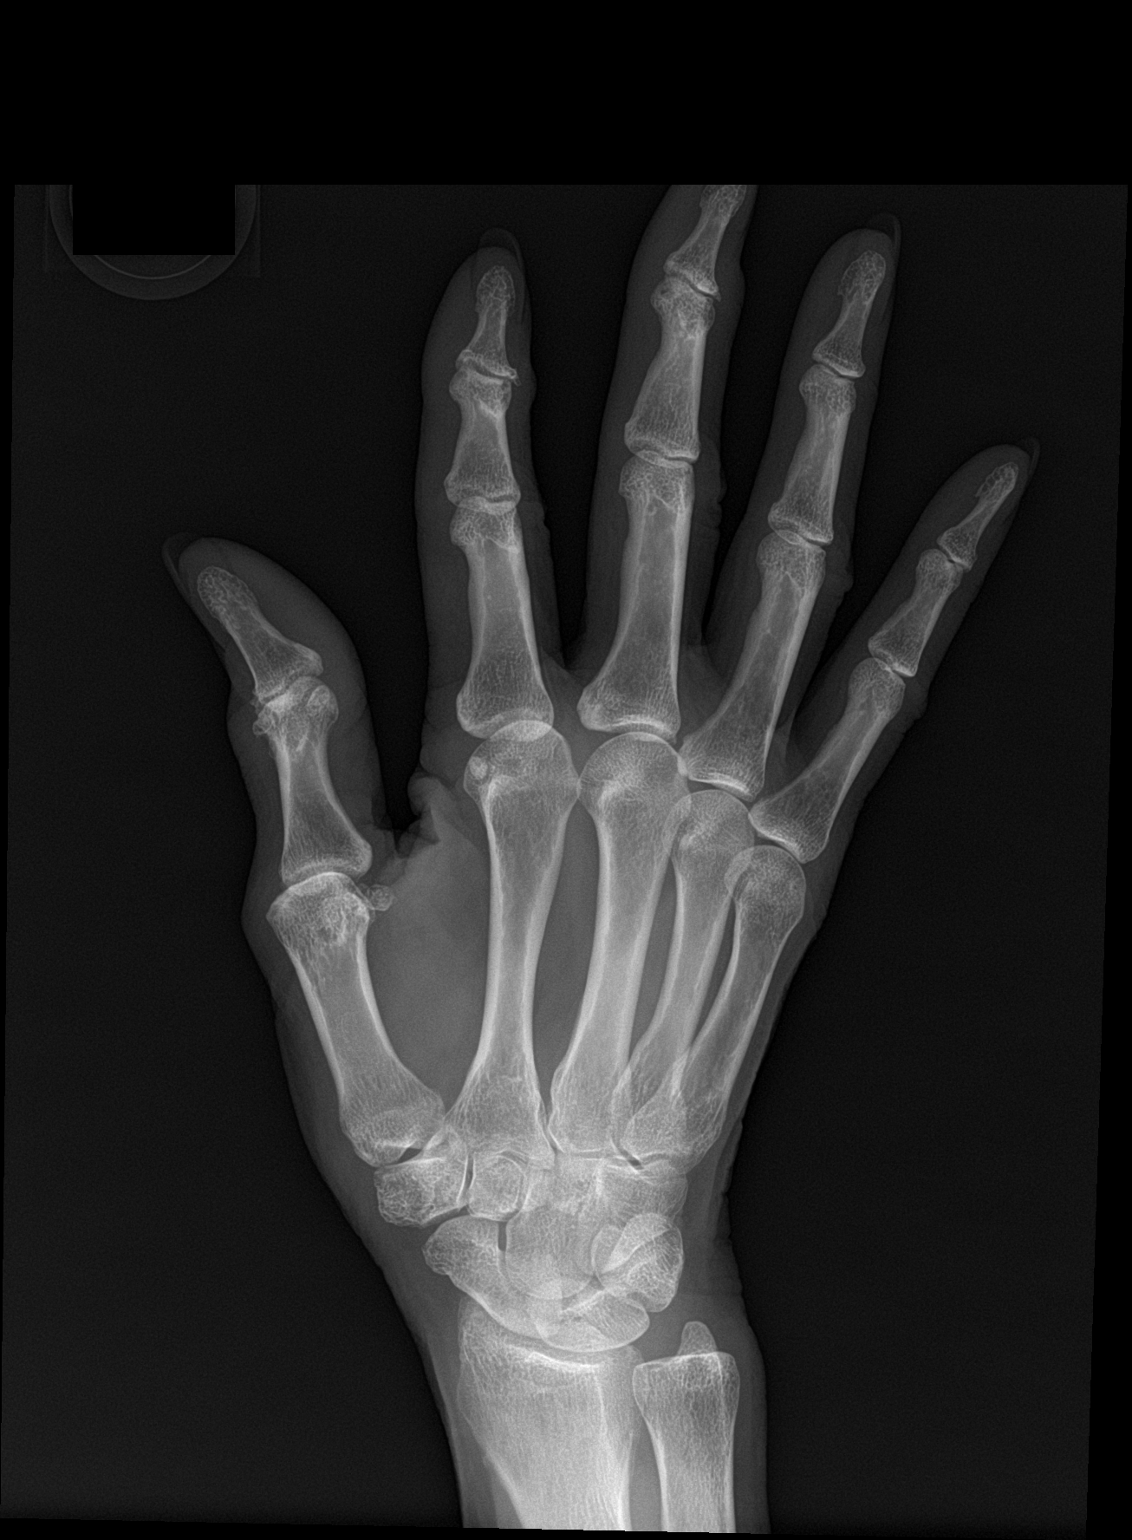

[hand lat]
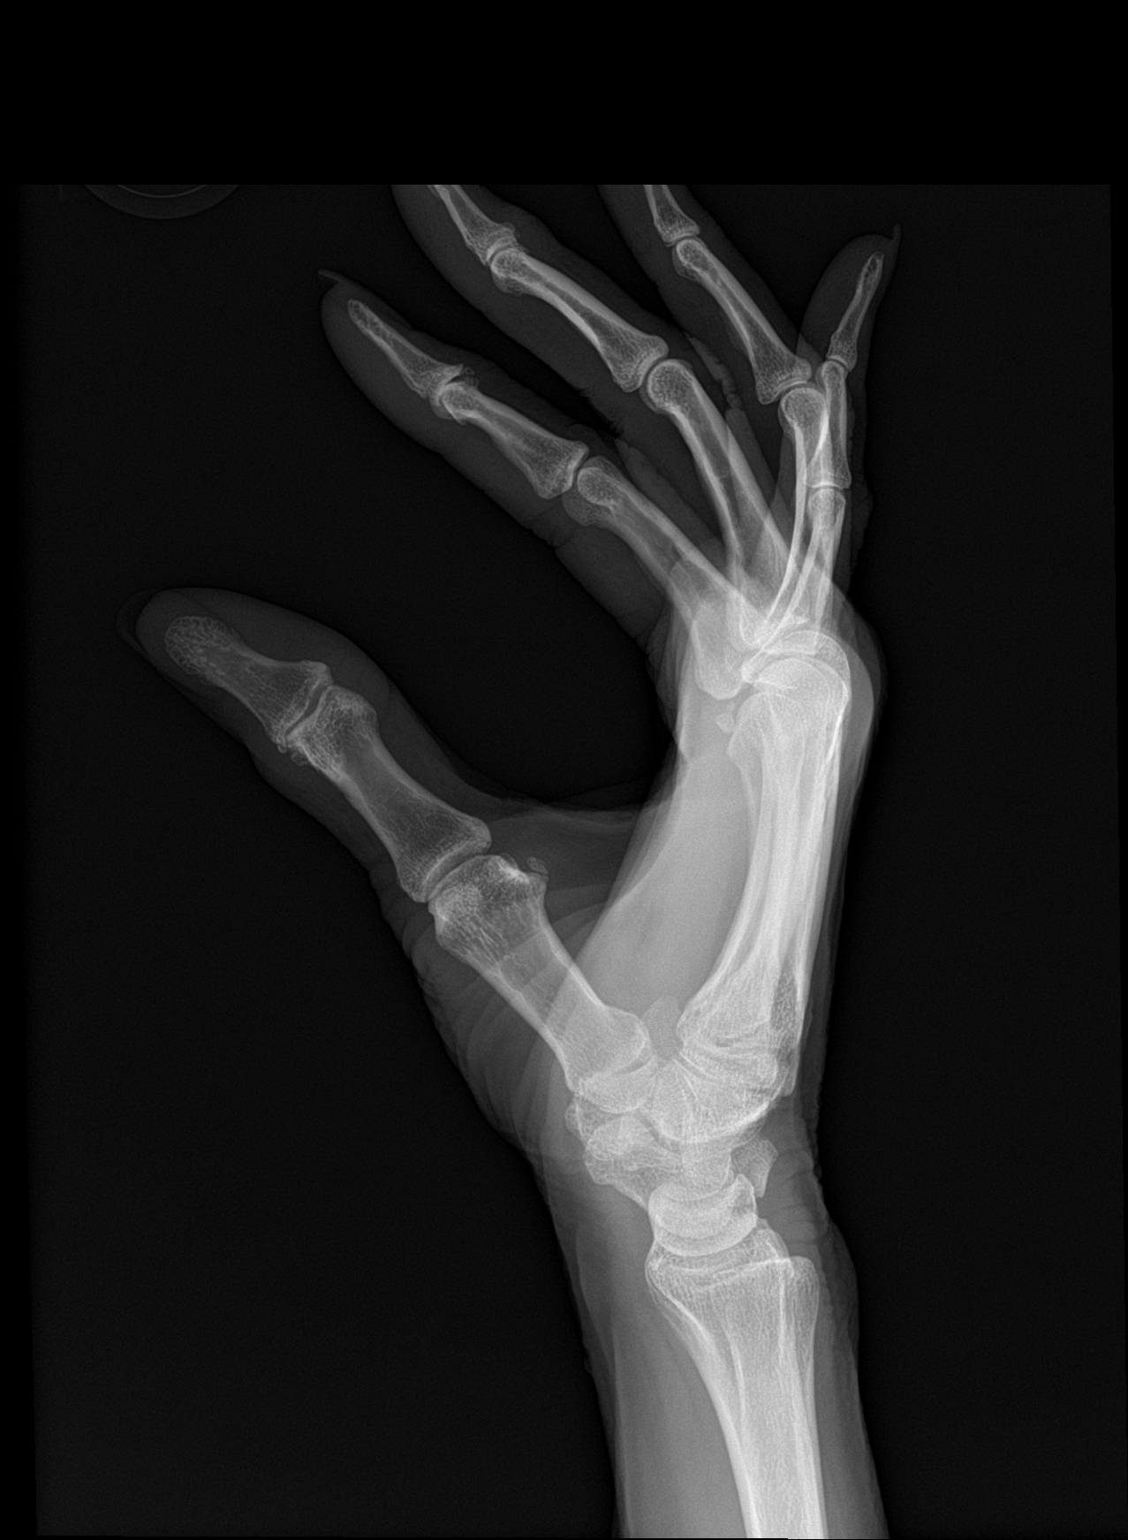

[3 of 3 positions shown; findings below may reference images not displayed]

FINDINGS: No acute fracture or dislocation is identified. DIP joint
degenerative changes are noted involving the index greater than long
fingers. Mild degenerative changes are also noted at the thumb IP
joint. No lytic or blastic osseous lesion is seen. No radiopaque
foreign body.
IMPRESSION: No acute osseous abnormality identified.

## 2016-10-21 IMAGING — CT CT CERVICAL SPINE W/O CM
4 of 6 series · 14 of 33 positions shown, 16 images · non-contrast
Comparison: None.

CLINICAL DATA: Patient status post trip and fall on concrete today.
Chin laceration, headache and neck pain. Initial encounter.

EXAM:
CT HEAD WITHOUT CONTRAST
CT CERVICAL SPINE WITHOUT CONTRAST
TECHNIQUE: Multidetector CT imaging of the head and cervical spine was
performed following the standard protocol without intravenous
contrast. Multiplanar CT image reconstructions of the cervical spine
were also generated.

[Series 5: c_spine 2.0 b41s st · axial · 0.33mm/px · z∈[-283,-191]mm · 3 of 93 slices shown, 4 images]
[im 24/93  soft-tissue]
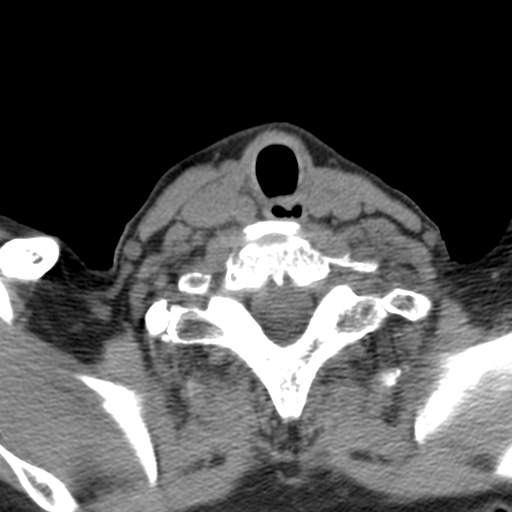
[im 24/93  bone]
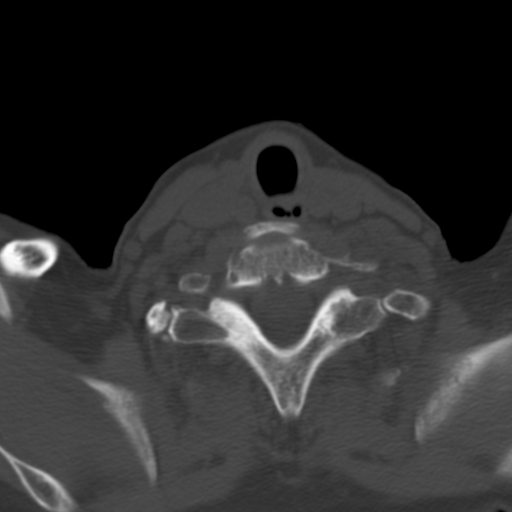
[im 47/93  bone]
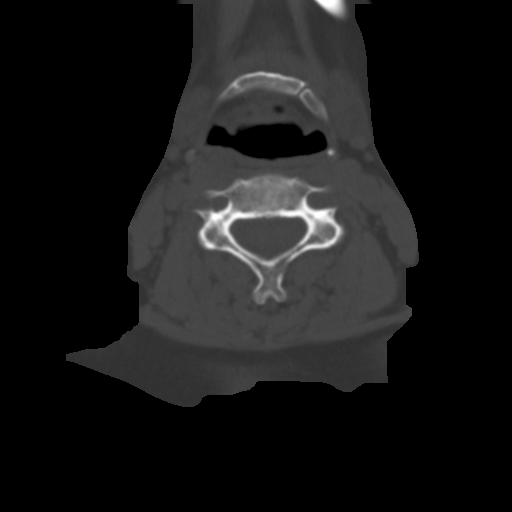
[im 70/93  bone]
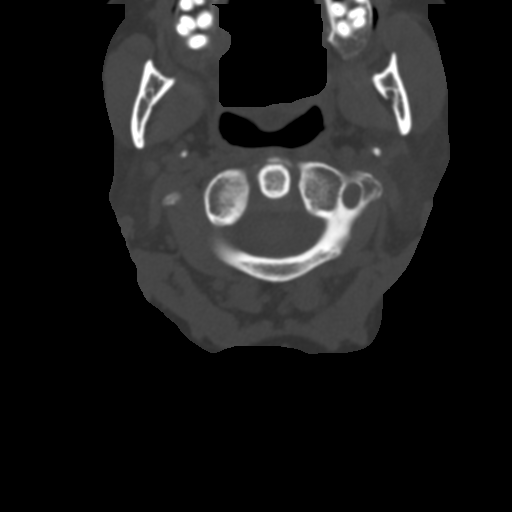

[Series 8: c_spine 2.0 coronal · coronal · 0.34mm/px · 3 of 52 slices shown]
[im 11/52  bone]
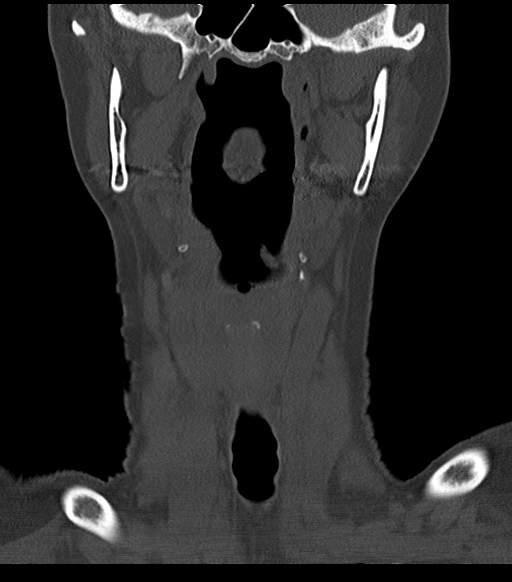
[im 21/52  bone]
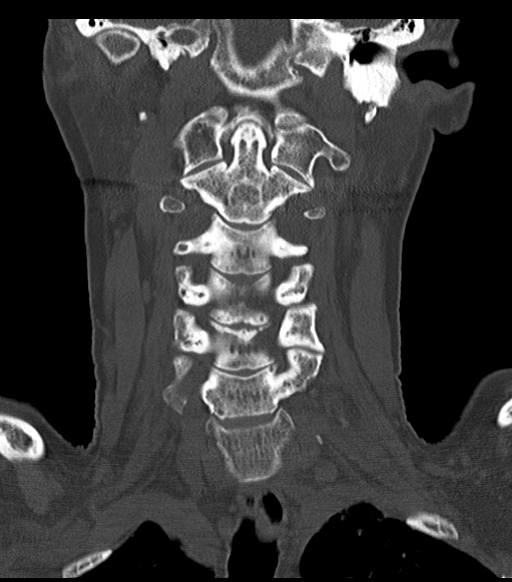
[im 31/52  bone]
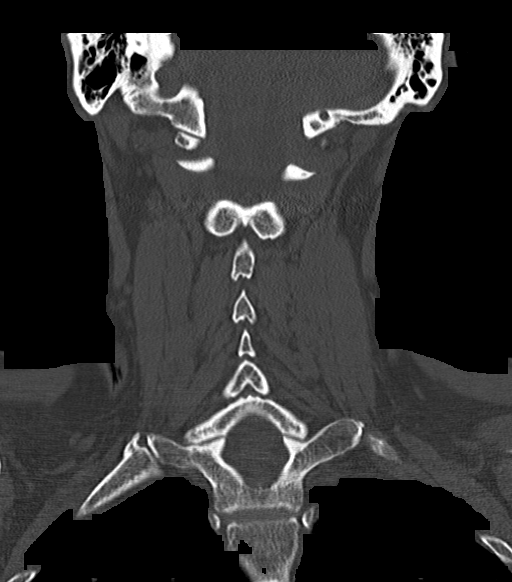

[Series 9: c_spine 2.0 sagittal · sagittal · 0.33mm/px · 5 of 56 slices shown, 6 images]
[im 19/56  bone]
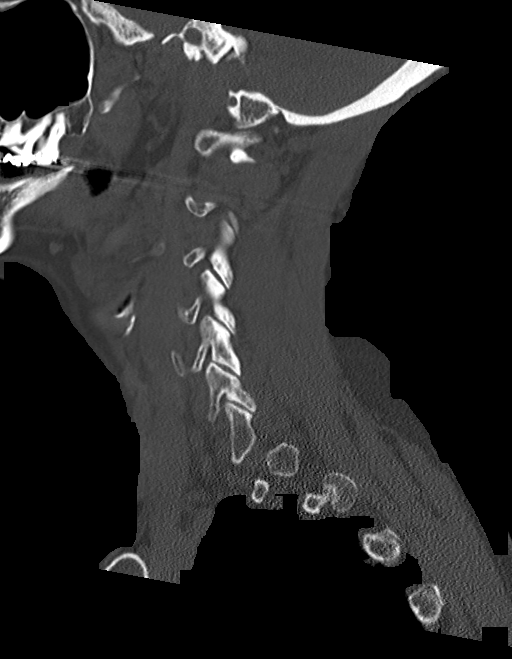
[im 23/56  bone]
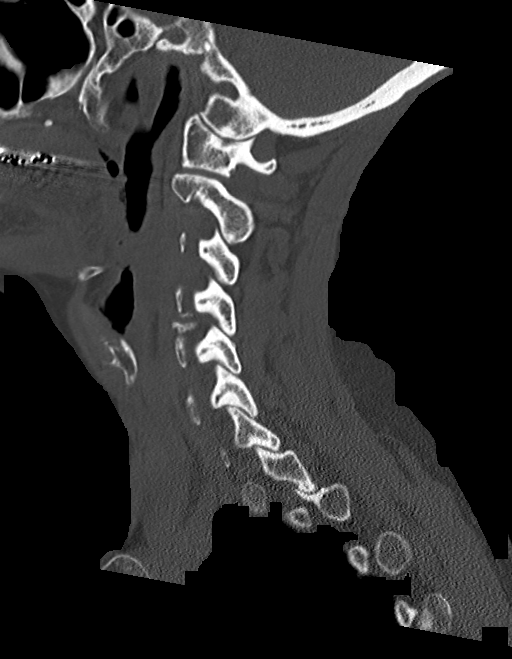
[im 28/56  soft-tissue]
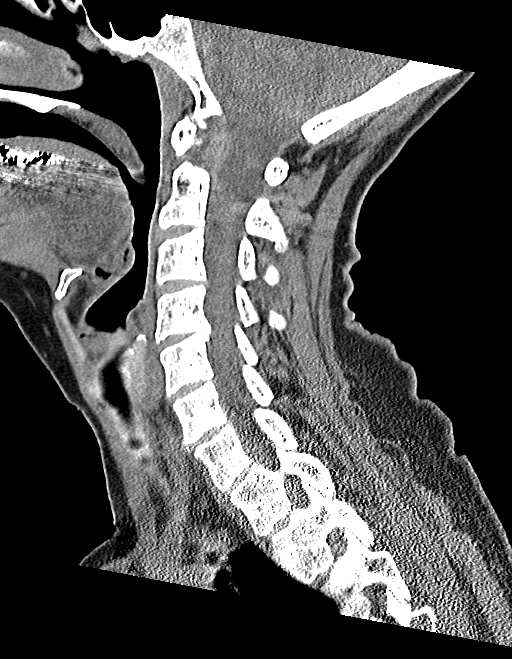
[im 28/56  bone]
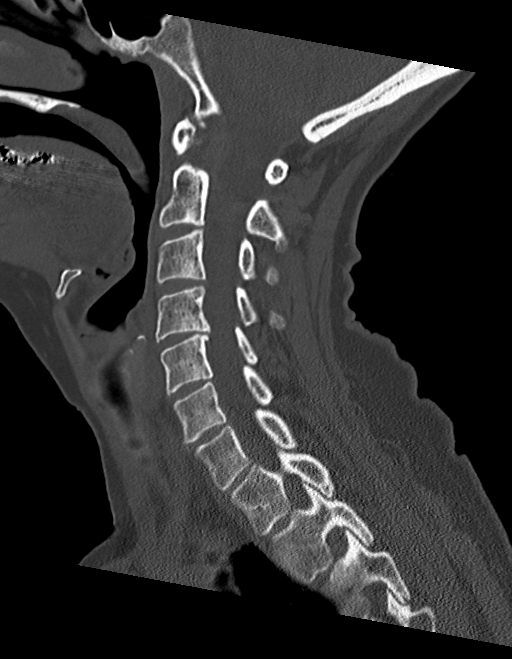
[im 33/56  bone]
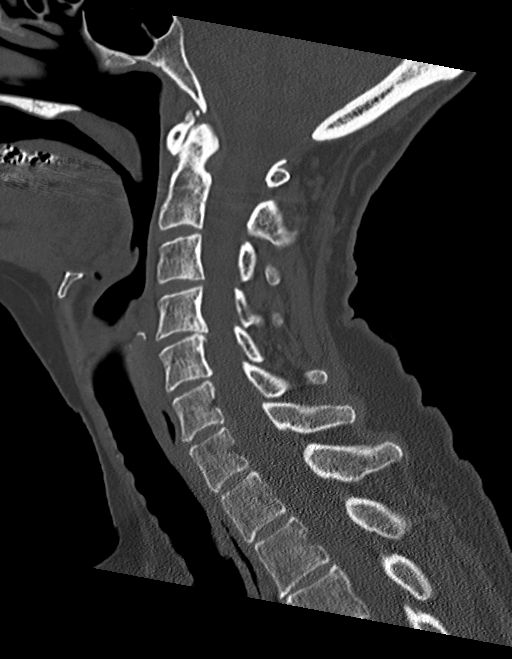
[im 37/56  bone]
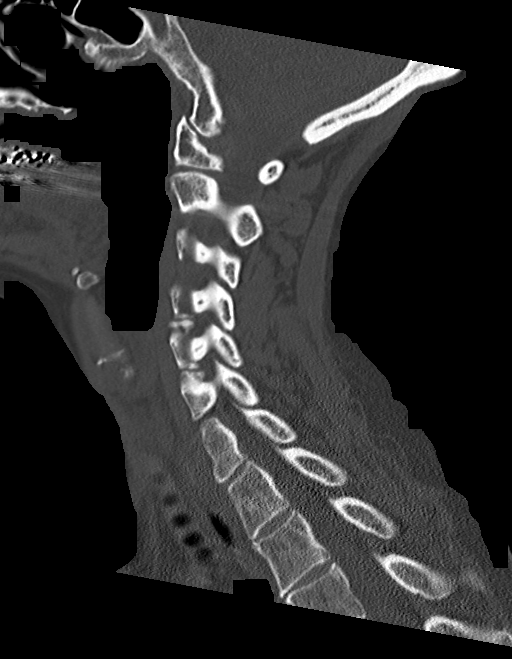

[Series 10: c_spine 2.0 orth ax · axial · 0.31mm/px · z∈[-307,-212]mm · 3 of 96 slices shown]
[im 24/96  bone]
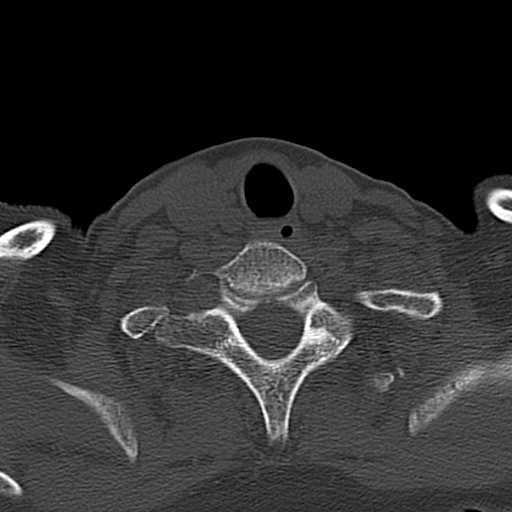
[im 48/96  bone]
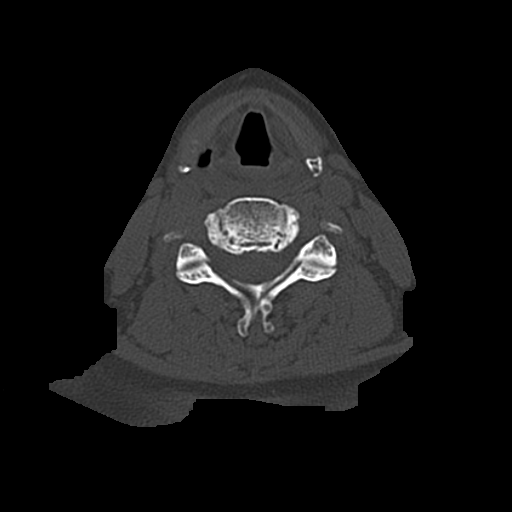
[im 72/96  bone]
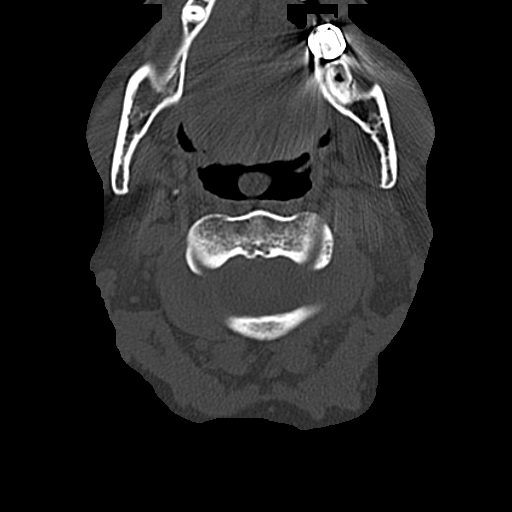

[14 of 33 positions shown; findings below may reference images not displayed]

FINDINGS: CT HEAD FINDINGS

The brain appears normal without hemorrhage, infarct, mass lesion,
mass effect, midline shift or abnormal extra-axial fluid collection.
No hydrocephalus or pneumocephalus. The calvarium is intact. Imaged
paranasal sinuses and mastoid air cells are clear.

CT CERVICAL SPINE FINDINGS

No fracture or malalignment is identified. Mild loss of disc space
height and endplate spurring are seen at C4-5 and C5-6. Lung apices
demonstrate scar.
IMPRESSION: Negative head CT.

No acute abnormality cervical spine with degenerative disc disease
C4-5 and C5-6 noted.

## 2016-11-12 DIAGNOSIS — H43812 Vitreous degeneration, left eye: Secondary | ICD-10-CM | POA: Insufficient documentation

## 2016-11-12 DIAGNOSIS — H43393 Other vitreous opacities, bilateral: Secondary | ICD-10-CM

## 2016-11-12 DIAGNOSIS — D3141 Benign neoplasm of right ciliary body: Secondary | ICD-10-CM

## 2016-11-12 HISTORY — DX: Benign neoplasm of right ciliary body: D31.41

## 2016-11-12 HISTORY — DX: Vitreous degeneration, left eye: H43.812

## 2016-11-12 HISTORY — DX: Other vitreous opacities, bilateral: H43.393

## 2016-12-10 DIAGNOSIS — M7060 Trochanteric bursitis, unspecified hip: Secondary | ICD-10-CM

## 2016-12-10 HISTORY — DX: Trochanteric bursitis, unspecified hip: M70.60

## 2017-01-02 DIAGNOSIS — M79604 Pain in right leg: Secondary | ICD-10-CM

## 2017-01-02 DIAGNOSIS — I8393 Asymptomatic varicose veins of bilateral lower extremities: Secondary | ICD-10-CM | POA: Insufficient documentation

## 2017-01-02 HISTORY — DX: Pain in right leg: M79.604

## 2017-01-02 HISTORY — DX: Asymptomatic varicose veins of bilateral lower extremities: I83.93

## 2017-01-23 DIAGNOSIS — S8011XA Contusion of right lower leg, initial encounter: Secondary | ICD-10-CM

## 2017-01-23 HISTORY — DX: Contusion of right lower leg, initial encounter: S80.11XA

## 2017-03-13 DIAGNOSIS — J45909 Unspecified asthma, uncomplicated: Secondary | ICD-10-CM | POA: Insufficient documentation

## 2017-03-13 HISTORY — DX: Unspecified asthma, uncomplicated: J45.909

## 2017-03-25 DIAGNOSIS — C4359 Malignant melanoma of other part of trunk: Secondary | ICD-10-CM | POA: Insufficient documentation

## 2017-03-25 HISTORY — DX: Malignant melanoma of other part of trunk: C43.59

## 2017-06-08 DIAGNOSIS — K638219 Small intestinal bacterial overgrowth, unspecified: Secondary | ICD-10-CM

## 2017-06-08 DIAGNOSIS — K6389 Other specified diseases of intestine: Secondary | ICD-10-CM

## 2017-06-08 HISTORY — DX: Other specified diseases of intestine: K63.89

## 2017-06-08 HISTORY — DX: Small intestinal bacterial overgrowth, unspecified: K63.8219

## 2017-06-29 DIAGNOSIS — R768 Other specified abnormal immunological findings in serum: Secondary | ICD-10-CM

## 2017-06-29 HISTORY — DX: Other specified abnormal immunological findings in serum: R76.8

## 2017-10-12 DIAGNOSIS — G43109 Migraine with aura, not intractable, without status migrainosus: Secondary | ICD-10-CM | POA: Insufficient documentation

## 2017-10-12 DIAGNOSIS — R252 Cramp and spasm: Secondary | ICD-10-CM | POA: Insufficient documentation

## 2017-10-12 HISTORY — DX: Cramp and spasm: R25.2

## 2017-10-12 HISTORY — DX: Migraine with aura, not intractable, without status migrainosus: G43.109

## 2017-10-29 ENCOUNTER — Encounter (HOSPITAL_BASED_OUTPATIENT_CLINIC_OR_DEPARTMENT_OTHER): Payer: Self-pay | Admitting: *Deleted

## 2017-10-29 ENCOUNTER — Other Ambulatory Visit: Payer: Self-pay

## 2017-10-29 ENCOUNTER — Emergency Department (HOSPITAL_BASED_OUTPATIENT_CLINIC_OR_DEPARTMENT_OTHER): Payer: Medicare Other

## 2017-10-29 ENCOUNTER — Observation Stay (HOSPITAL_BASED_OUTPATIENT_CLINIC_OR_DEPARTMENT_OTHER)
Admission: EM | Admit: 2017-10-29 | Discharge: 2017-10-31 | Disposition: A | Discharge status: Home / Self Care | Payer: Medicare Other | Attending: Internal Medicine | Admitting: Internal Medicine

## 2017-10-29 DIAGNOSIS — D801 Nonfamilial hypogammaglobulinemia: Secondary | ICD-10-CM | POA: Diagnosis not present

## 2017-10-29 DIAGNOSIS — F419 Anxiety disorder, unspecified: Secondary | ICD-10-CM | POA: Diagnosis present

## 2017-10-29 DIAGNOSIS — Z87891 Personal history of nicotine dependence: Secondary | ICD-10-CM | POA: Insufficient documentation

## 2017-10-29 DIAGNOSIS — R079 Chest pain, unspecified: Secondary | ICD-10-CM

## 2017-10-29 DIAGNOSIS — Z882 Allergy status to sulfonamides status: Secondary | ICD-10-CM | POA: Insufficient documentation

## 2017-10-29 DIAGNOSIS — I471 Supraventricular tachycardia: Secondary | ICD-10-CM | POA: Insufficient documentation

## 2017-10-29 DIAGNOSIS — E063 Autoimmune thyroiditis: Secondary | ICD-10-CM | POA: Diagnosis present

## 2017-10-29 DIAGNOSIS — I4891 Unspecified atrial fibrillation: Secondary | ICD-10-CM | POA: Insufficient documentation

## 2017-10-29 DIAGNOSIS — Z880 Allergy status to penicillin: Secondary | ICD-10-CM | POA: Diagnosis not present

## 2017-10-29 DIAGNOSIS — E038 Other specified hypothyroidism: Secondary | ICD-10-CM | POA: Diagnosis present

## 2017-10-29 DIAGNOSIS — Z885 Allergy status to narcotic agent status: Secondary | ICD-10-CM | POA: Diagnosis not present

## 2017-10-29 DIAGNOSIS — D849 Immunodeficiency, unspecified: Secondary | ICD-10-CM | POA: Diagnosis present

## 2017-10-29 DIAGNOSIS — Z8679 Personal history of other diseases of the circulatory system: Secondary | ICD-10-CM

## 2017-10-29 DIAGNOSIS — M1990 Unspecified osteoarthritis, unspecified site: Secondary | ICD-10-CM | POA: Insufficient documentation

## 2017-10-29 DIAGNOSIS — J449 Chronic obstructive pulmonary disease, unspecified: Secondary | ICD-10-CM | POA: Insufficient documentation

## 2017-10-29 DIAGNOSIS — R5383 Other fatigue: Secondary | ICD-10-CM | POA: Diagnosis present

## 2017-10-29 DIAGNOSIS — Z8673 Personal history of transient ischemic attack (TIA), and cerebral infarction without residual deficits: Secondary | ICD-10-CM | POA: Insufficient documentation

## 2017-10-29 DIAGNOSIS — Z8249 Family history of ischemic heart disease and other diseases of the circulatory system: Secondary | ICD-10-CM | POA: Diagnosis not present

## 2017-10-29 HISTORY — DX: Chest pain, unspecified: R07.9

## 2017-10-29 LAB — I-STAT VENOUS BLOOD GAS, ED
ACID-BASE EXCESS: 6 mmol/L — AB (ref 0.0–2.0)
Bicarbonate: 32.2 mmol/L — ABNORMAL HIGH (ref 20.0–28.0)
O2 SAT: 45 %
PCO2 VEN: 51.7 mmHg (ref 44.0–60.0)
PO2 VEN: 26 mmHg — AB (ref 32.0–45.0)
Patient temperature: 98.76
TCO2: 34 mmol/L — AB (ref 22–32)
pH, Ven: 7.402 (ref 7.250–7.430)

## 2017-10-29 LAB — BASIC METABOLIC PANEL
Anion gap: 8 (ref 5–15)
BUN: 17 mg/dL (ref 6–20)
CO2: 27 mmol/L (ref 22–32)
CREATININE: 0.82 mg/dL (ref 0.44–1.00)
Calcium: 8.5 mg/dL — ABNORMAL LOW (ref 8.9–10.3)
Chloride: 105 mmol/L (ref 101–111)
GFR calc Af Amer: >60 mL/min (ref >60)
GLUCOSE: 93 mg/dL (ref 65–99)
Potassium: 3.8 mmol/L (ref 3.5–5.1)
SODIUM: 140 mmol/L (ref 135–145)

## 2017-10-29 LAB — CBC
HCT: 42.7 % (ref 36.0–46.0)
Hemoglobin: 14.3 g/dL (ref 12.0–15.0)
MCH: 30 pg (ref 26.0–34.0)
MCHC: 33.5 g/dL (ref 30.0–36.0)
MCV: 89.7 fL (ref 78.0–100.0)
PLATELETS: 337 10*3/uL (ref 150–400)
RBC: 4.76 MIL/uL (ref 3.87–5.11)
RDW: 14.9 % (ref 11.5–15.5)
WBC: 6.8 10*3/uL (ref 4.0–10.5)

## 2017-10-29 LAB — TROPONIN I: Troponin I: <0.03 ng/mL (ref <0.03)

## 2017-10-29 LAB — TSH: TSH: 0.126 u[IU]/mL — AB (ref 0.350–4.500)

## 2017-10-29 LAB — T4, FREE: FREE T4: 1.26 ng/dL — AB (ref 0.61–1.12)

## 2017-10-29 MED ORDER — SODIUM CHLORIDE 0.9 % IV BOLUS
1000.0000 mL | Freq: Once | INTRAVENOUS | Status: AC
  Administered 2017-10-29: 1000 mL via INTRAVENOUS

## 2017-10-29 MED ORDER — ASPIRIN 81 MG PO CHEW
324.0000 mg | CHEWABLE_TABLET | Freq: Once | ORAL | Status: AC
  Administered 2017-10-29: 324 mg via ORAL
  Filled 2017-10-29: qty 4

## 2017-10-29 MED ORDER — ASPIRIN EC 325 MG PO TBEC: 325.0000 mg | DELAYED_RELEASE_TABLET | Freq: Every day | ORAL | Status: DC

## 2017-10-29 NOTE — ED Notes (Signed)
Patient states recent work-up for near syncopal episodes with neurology at [facility].  Patient reports onset of chest tightness and shortness of breath today.

## 2017-10-29 NOTE — Care Management (Signed)
This is a no charge note  Transfer from [facility] per Dr. Avon Gully  72 year old lady with past medical history of atrial fibrillation not on anticoagulants, COPD, TIA, hypothyroidism, anxiety, immunodeficiency disorder, Hashimoto disease, who presents with shortness of breath, generalized weakness, chest pain and palpitation.  Patient has been having generalized weakness for about 1 week. She had near syncope episode, and was seen at [facility] yesterday. She was scheduled to get EEG, but did not do image. She complains of exertional shortness of breath, chest pressure on exertion and palpitation.   Patient was found to have negative troponin, EKG showed ST depression in the inferior leads, WBC 6.8, electrolytes renal function okay, temperature normal, no tachycardia, oxygen saturation 95% on room air, chest x-ray showed chronic bronchitic change. Patient is admitted to telemetry bed for observation. Aspirin 325 mg was ordered by me.  Please call manager of [facility] at [phone] when pt arrives to floor   Ivor Costa, MD  [facility] Pager [phone]  If 7PM-7AM, please contact night-coverage [email or web address] Password [facility] 10/29/2017, 10:16 PM

## 2017-10-29 NOTE — ED Provider Notes (Signed)
[facility] EMERGENCY DEPARTMENT Provider Note   CSN: [id number] Arrival date & time: 10/29/17  1757 History   Chief Complaint Chief Complaint  Patient presents with  . Chest Pain    HPI Kimberly Phillips is a 72 y.o. female presenting with chest pain. PMH significant for a.flutter, COPD, Hashimoto's.   HPI   Patient presents with chest pressure. Endorses dyspnea on exertion as well. Says that the chest pressure is located in mid-epigastric region and only lasts for a few seconds. She is followed by cardiology for a.flutter, however was unable to schedule an appt today. She is on dilt for a.flutter and has not missed any doses. Pressure is also worse with movement. Pressure is not present currently. She has had palpitations more frequently than usual today. Sensation does not radiate.   Also endorses worsening generalized weakness over past 1w. Says that this makes her "wobbly," so much so that she has not been able to work this week. She has recently had episodes which she describes as "blacking out," for which she was seen by neuro yesterday. No imaging was performed, however she does have an EEG scheduled next week. Says she feels so weak currently that she is hardly able to keep her eyes open. Is prescribed Xanax and Norco. Took Xanax this AM but rarely takes Norco.   Past Medical History:  Diagnosis Date  . A-fib ([place])   . Arthritis   . Blood transfusion without reported diagnosis   . COPD (chronic obstructive pulmonary disease) ([place])   . Hashimoto's disease   . Immune deficiency disorder ([place])   . Pneumonia   . TIA (transient ischemic attack)     Patient Active Problem List   Diagnosis Date Noted  . Chest pain 10/29/2017    Past Surgical History:  Procedure Laterality Date  . ABDOMINAL HYSTERECTOMY    . APPENDECTOMY    . CATARACT EXTRACTION Bilateral   . CHOLECYSTECTOMY    . TONSILLECTOMY       OB History   None      Home Medications    Prior to  Admission medications   Medication Sig Start Date End Date Taking? Authorizing Provider  ALPRAZolam Duanne Moron) 1 MG tablet Take 1 mg by mouth as needed for anxiety.   Yes [provider]  cefUROXime (CEFTIN) 250 MG tablet Take 250 mg by mouth 2 (two) times daily with a meal.   Yes [provider]  diltiazem (DILACOR XR) 180 MG 24 hr capsule Take 180 mg by mouth daily.   Yes [provider]  estradiol (ESTRACE) 0.5 MG tablet Take 0.5 mg by mouth daily.   Yes [provider]  HYDROcodone-acetaminophen (NORCO) 10-325 MG tablet Take 1 tablet by mouth every 6 (six) hours as needed (PRN for headaches).   Yes [provider]  levothyroxine (SYNTHROID, LEVOTHROID) 125 MCG tablet Take 125 mcg by mouth daily before breakfast.   Yes [provider]    Family History History reviewed. No pertinent family history.  Social History Social History   Tobacco Use  . Smoking status: Former Research scientist (life sciences)  . Smokeless tobacco: Never Used  Substance Use Topics  . Alcohol use: No  . Drug use: No     Allergies   Ciprofloxacin; Macrolides and ketolides; and Sulfa antibiotics   Review of Systems Review of Systems  Constitutional: Positive for appetite change, diaphoresis (At night) and fatigue. Negative for fever.  Eyes: Negative for visual disturbance.  Respiratory: Positive for chest tightness and  shortness of breath (on exertion).   Cardiovascular: Positive for palpitations.  Gastrointestinal: Negative for constipation, diarrhea, nausea and vomiting.  Neurological: Positive for weakness.   Physical Exam Updated Vital Signs BP (!) 121/53 (BP Location: Left Arm)   Pulse 83   Temp 97.8 F (36.6 C) (Oral)   Resp 18   Ht 5' 7.5" (1.715 m)   Wt 54.4 kg (120 lb)   SpO2 95%   BMI 18.52 kg/m   Physical Exam  Constitutional: She is oriented to person, place, and time. She appears well-developed and well-nourished.  Lying in bed with eyes closed  throughout majority of exam. Is able to answer questions appropriately and stay awake. Appears very fatigued but in NAD.   HENT:  Head: Normocephalic and atraumatic.  Nose: Nose normal.  Mouth/Throat: Oropharynx is clear and moist. No oropharyngeal exudate.  Eyes: Pupils are equal, round, and reactive to light. Conjunctivae and EOM are normal. Right eye exhibits no discharge. Left eye exhibits no discharge.  Neck: Normal range of motion. Neck supple.  Cardiovascular: Normal rate, regular rhythm and normal heart sounds.  No murmur heard. Pulmonary/Chest: Effort normal and breath sounds normal. No respiratory distress. She has no wheezes.  Abdominal: Soft. Bowel sounds are normal. She exhibits no distension. There is no tenderness.  Musculoskeletal: Normal range of motion. She exhibits no edema.  Lymphadenopathy:    She has no cervical adenopathy.  Neurological: She is alert and oriented to person, place, and time. No cranial nerve deficit.  Skin: Skin is warm and dry.  Psychiatric: She has a normal mood and affect. Her behavior is normal.  Nursing note and vitals reviewed.  ED Treatments / Results  Labs (all labs ordered are listed, but only abnormal results are displayed) Labs Reviewed  BASIC METABOLIC PANEL - Abnormal; Notable for the following components:      Result Value   Calcium 8.5 (*)    All other components within normal limits  I-STAT VENOUS BLOOD GAS, ED - Abnormal; Notable for the following components:   pO2, Ven 26.0 (*)    Bicarbonate 32.2 (*)    TCO2 34 (*)    Acid-Base Excess 6.0 (*)    All other components within normal limits  CBC  TROPONIN I  BLOOD GAS, VENOUS  TSH  T4, FREE    EKG EKG Interpretation  Date/Time:  Thursday October 29 2017 18:03:28 EDT Ventricular Rate:  96 PR Interval:  134 QRS Duration: 74 QT Interval:  346 QTC Calculation: 437 R Axis:   83 Text Interpretation:  Normal sinus rhythm Right atrial enlargement Nonspecific ST abnormality  Abnormal ECG NO STEMI No old tracing to compare Confirmed by Addison Lank [phone]) on 10/29/2017 6:07:23 PM   Radiology Dg Chest 2 View  Result Date: 10/29/2017 CLINICAL DATA:  Atrial flutter with weak spells and shortness of breath. EXAM: CHEST - 2 VIEW COMPARISON:  06/26/2017 and 06/19/2016 FINDINGS: The cardiac silhouette, mediastinal and hilar contours are within normal limits and stable. The lungs demonstrate hyperinflation and there are chronic appearing bronchitic changes with increased interstitial markings and suspected vague nodularity. The patient at a prior neck CT which showed the lung apices. There was clearly a pattern of centrilobular nodules and probable tree-in-bud appearance suggesting chronic inflammation or possible atypical infection such as MAC. A full chest CT may be helpful for further evaluation. IMPRESSION: Chronic appearing bronchitic type changes with possible chronic inflammation or atypical infection based on the prior CT scan. Recommend chest CT for further  evaluation. Electronically Signed   By: Marijo Sanes M.D.   On: 10/29/2017 19:18    Procedures Procedures (including critical care time)  Medications Ordered in ED Medications  aspirin EC tablet 325 mg (has no administration in time range)  sodium chloride 0.9 % bolus 1,000 mL (1,000 mLs Intravenous New Bag/Given 10/29/17 1941)     Initial Impression / Assessment and Plan / ED Course  I have reviewed the triage vital signs and the nursing notes.  Pertinent labs & imaging results that were available during my care of the patient were reviewed by me and considered in my medical decision making (see chart for details).     2108 Patient presenting with chest pressure and weakness. Trop neg thus far, however given Heart Score of 6 (ST depression on EKG, age, moderately concerning history, and history of TIA), admission for ACS rule out is indicated. Consult to hospitalist placed.   2139 Spoke with Dr. Blaine Hamper.  Agreed to accept patient at [facility]. Stable for transfer.    Final Clinical Impressions(s) / ED Diagnoses   Final diagnoses:  None    ED Discharge Orders    None     Adin Hector, MD, MPH PGY-3 [facility] Pager [phone]    Verner Mould, MD 10/29/17 2148    Fatima Blank, MD 10/29/17 [phone]

## 2017-10-29 NOTE — ED Triage Notes (Signed)
Pt has A Flutter, Has been having weak spells that are getting worse, she is SOB and has chest pressure.

## 2017-10-30 ENCOUNTER — Encounter (HOSPITAL_COMMUNITY): Payer: Self-pay | Admitting: Medical

## 2017-10-30 ENCOUNTER — Observation Stay (HOSPITAL_BASED_OUTPATIENT_CLINIC_OR_DEPARTMENT_OTHER): Payer: Medicare Other

## 2017-10-30 ENCOUNTER — Other Ambulatory Visit: Payer: Self-pay

## 2017-10-30 DIAGNOSIS — Z8679 Personal history of other diseases of the circulatory system: Secondary | ICD-10-CM

## 2017-10-30 DIAGNOSIS — E063 Autoimmune thyroiditis: Secondary | ICD-10-CM | POA: Diagnosis present

## 2017-10-30 DIAGNOSIS — D849 Immunodeficiency, unspecified: Secondary | ICD-10-CM | POA: Diagnosis present

## 2017-10-30 DIAGNOSIS — F419 Anxiety disorder, unspecified: Secondary | ICD-10-CM | POA: Diagnosis present

## 2017-10-30 DIAGNOSIS — I351 Nonrheumatic aortic (valve) insufficiency: Secondary | ICD-10-CM

## 2017-10-30 DIAGNOSIS — R5383 Other fatigue: Secondary | ICD-10-CM | POA: Diagnosis present

## 2017-10-30 DIAGNOSIS — R079 Chest pain, unspecified: Secondary | ICD-10-CM | POA: Diagnosis not present

## 2017-10-30 DIAGNOSIS — E038 Other specified hypothyroidism: Secondary | ICD-10-CM | POA: Diagnosis present

## 2017-10-30 HISTORY — DX: Personal history of other diseases of the circulatory system: Z86.79

## 2017-10-30 HISTORY — DX: Anxiety disorder, unspecified: F41.9

## 2017-10-30 HISTORY — DX: Other specified hypothyroidism: E03.8

## 2017-10-30 HISTORY — DX: Autoimmune thyroiditis: E06.3

## 2017-10-30 HISTORY — DX: Immunodeficiency, unspecified: D84.9

## 2017-10-30 HISTORY — DX: Other fatigue: R53.83

## 2017-10-30 LAB — ECHOCARDIOGRAM COMPLETE
Height: 67.5 in
WEIGHTICAEL: 1915.2 [oz_av]

## 2017-10-30 LAB — CBC WITH DIFFERENTIAL/PLATELET
Basophils Absolute: 0 10*3/uL (ref 0.0–0.1)
Basophils Relative: 1 %
EOS ABS: 0.2 10*3/uL (ref 0.0–0.7)
EOS PCT: 4 %
HCT: 35.5 % — ABNORMAL LOW (ref 36.0–46.0)
Hemoglobin: 12 g/dL (ref 12.0–15.0)
LYMPHS PCT: 34 %
Lymphs Abs: 1.9 10*3/uL (ref 0.7–4.0)
MCH: 30.4 pg (ref 26.0–34.0)
MCHC: 33.8 g/dL (ref 30.0–36.0)
MCV: 89.9 fL (ref 78.0–100.0)
MONO ABS: 0.5 10*3/uL (ref 0.1–1.0)
Monocytes Relative: 9 %
Neutro Abs: 2.9 10*3/uL (ref 1.7–7.7)
Neutrophils Relative %: 52 %
PLATELETS: 273 10*3/uL (ref 150–400)
RBC: 3.95 MIL/uL (ref 3.87–5.11)
RDW: 14.9 % (ref 11.5–15.5)
WBC: 5.5 10*3/uL (ref 4.0–10.5)

## 2017-10-30 LAB — LIPID PANEL
CHOL/HDL RATIO: 3.5 ratio
Cholesterol: 146 mg/dL (ref 0–200)
HDL: 42 mg/dL (ref >40)
LDL CALC: 91 mg/dL (ref 0–99)
TRIGLYCERIDES: 64 mg/dL (ref <150)
VLDL: 13 mg/dL (ref 0–40)

## 2017-10-30 LAB — BASIC METABOLIC PANEL
ANION GAP: 6 (ref 5–15)
BUN: 12 mg/dL (ref 6–20)
CALCIUM: 7.7 mg/dL — AB (ref 8.9–10.3)
CO2: 25 mmol/L (ref 22–32)
Chloride: 109 mmol/L (ref 101–111)
Creatinine, Ser: 0.69 mg/dL (ref 0.44–1.00)
GFR calc Af Amer: >60 mL/min (ref >60)
GLUCOSE: 92 mg/dL (ref 65–99)
Potassium: 3.7 mmol/L (ref 3.5–5.1)
SODIUM: 140 mmol/L (ref 135–145)

## 2017-10-30 LAB — TROPONIN I
Troponin I: <0.03 ng/mL (ref <0.03)
Troponin I: <0.03 ng/mL (ref <0.03)

## 2017-10-30 LAB — D-DIMER, QUANTITATIVE: D-Dimer, Quant: 0.34 ug/mL-FEU (ref 0.00–0.50)

## 2017-10-30 LAB — MAGNESIUM: Magnesium: 2.2 mg/dL (ref 1.7–2.4)

## 2017-10-30 MED ORDER — MORPHINE SULFATE (PF) 2 MG/ML IV SOLN: 2.0000 mg | INTRAVENOUS | Status: DC | PRN

## 2017-10-30 MED ORDER — LEVOTHYROXINE SODIUM 100 MCG PO TABS: 100.0000 ug | ORAL_TABLET | Freq: Every day | ORAL | Status: DC

## 2017-10-30 MED ORDER — LEVOTHYROXINE SODIUM 112 MCG PO TABS
112.0000 ug | ORAL_TABLET | Freq: Every day | ORAL | Status: DC
  Administered 2017-10-31: 112 ug via ORAL
  Filled 2017-10-30: qty 1

## 2017-10-30 MED ORDER — LEVOTHYROXINE SODIUM 100 MCG PO TABS: 100.0000 ug | ORAL_TABLET | Freq: Once | ORAL | Status: DC

## 2017-10-30 MED ORDER — TIZANIDINE HCL 4 MG PO TABS
4.0000 mg | ORAL_TABLET | Freq: Every day | ORAL | Status: DC
  Administered 2017-10-30 (×2): 4 mg via ORAL
  Filled 2017-10-30 (×2): qty 1

## 2017-10-30 MED ORDER — ACETAMINOPHEN 325 MG PO TABS: 650.0000 mg | ORAL_TABLET | ORAL | Status: DC | PRN

## 2017-10-30 MED ORDER — LEVOTHYROXINE SODIUM 25 MCG PO TABS: 125.0000 ug | ORAL_TABLET | Freq: Every day | ORAL | Status: DC

## 2017-10-30 MED ORDER — GI COCKTAIL ~~LOC~~
30.0000 mL | Freq: Four times a day (QID) | ORAL | Status: DC | PRN
Start: 2017-10-30 — End: 2017-10-31

## 2017-10-30 MED ORDER — IPRATROPIUM-ALBUTEROL 0.5-2.5 (3) MG/3ML IN SOLN: 3.0000 mL | RESPIRATORY_TRACT | Status: DC | PRN

## 2017-10-30 MED ORDER — LEVOTHYROXINE SODIUM 75 MCG PO TABS: 112.5000 ug | ORAL_TABLET | Freq: Every day | ORAL | Status: DC

## 2017-10-30 MED ORDER — ENOXAPARIN SODIUM 40 MG/0.4ML ~~LOC~~ SOLN
40.0000 mg | Freq: Every day | SUBCUTANEOUS | Status: DC
  Filled 2017-10-30: qty 0.4

## 2017-10-30 MED ORDER — ALPRAZOLAM 0.5 MG PO TABS
1.0000 mg | ORAL_TABLET | Freq: Two times a day (BID) | ORAL | Status: DC | PRN
Start: 2017-10-30 — End: 2017-10-31
  Administered 2017-10-30 (×2): 1 mg via ORAL
  Filled 2017-10-30 (×2): qty 2

## 2017-10-30 MED ORDER — ONDANSETRON HCL 4 MG/2ML IJ SOLN: 4.0000 mg | Freq: Four times a day (QID) | INTRAMUSCULAR | Status: DC | PRN

## 2017-10-30 MED ORDER — HYDROCODONE-ACETAMINOPHEN 10-325 MG PO TABS
1.0000 | ORAL_TABLET | Freq: Four times a day (QID) | ORAL | Status: DC | PRN
  Administered 2017-10-30 (×2): 1 via ORAL
  Filled 2017-10-30 (×2): qty 1

## 2017-10-30 MED ORDER — DILTIAZEM HCL ER COATED BEADS 180 MG PO CP24
180.0000 mg | ORAL_CAPSULE | Freq: Every day | ORAL | Status: DC
  Administered 2017-10-30 (×2): 180 mg via ORAL
  Filled 2017-10-30 (×2): qty 1

## 2017-10-30 NOTE — Progress Notes (Signed)
Patient refused bed alarm. Will continue to monitor patient. 

## 2017-10-30 NOTE — Consult Note (Addendum)
Cardiology Consultation:   Patient ID: Kimberly Phillips; [id number]; 01-Mar-1946   Admit date: 10/29/2017 Date of Consult: 10/30/2017  Primary Care Provider: Maylon Peppers, MD Primary Cardiologist: Dr. Donnetta Hutching [facility]) Primary Electrophysiologist:  None   Patient Profile:   Kimberly Phillips is a 72 y.o. female with a PMH of SVT, TIA, hashimotos thyroiditis, hypogammaglobulinemia, COPD, anxiety, and GERD who is being seen today for the evaluation of chest pain at the request of Dr. Rockne Menghini.  History of Present Illness:   Kimberly Phillips presents with complaints of exertional chest pain and associated SOB that started yesterday. 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 Symptoms persisted for ~1 hour and resolved spontaneously. She notes occasional episodes of chest pressure in the past which typically last for minutes, however given the duration of symptoms she decided to seek further medical attention at [facility] where she had an EKG with ST depressions, and she was transferred to [facility] for further evaluation.   She was in her usual state of health until 1 week ago when she began feeling generalized weakness. She was evaluated by her outpatient neurologist 10/28/17 whom she follows with for excessive daytime sleepiness and migraines. She was recommended for an EEG to further investigate spells of altered consciousness which has not occurred yet. She reports frequent "spells" of weakness which typically last 1-2 days and then she returns to baseline, however this current episode has persisted for 1 week.   She was last evaluated by her cardiologist 07/2017 in follow-up of recent hospitalization for syncope. She had complaints of generalized weakness and SOB at that time. 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 Last echocardiogram 05/2017 with EF 60-65%, mild AR, otherwise normal. Last ischemic evaluation included an exercise stress  test, reportedly 2 years ago, c/b syncopal episode. She denies prior cardiac catheterization. She is a former smoker, quit 20+ years ago. Family history of CAD in her father who had multiple MI's and CABG; age of onset in his 55s.   She has not had any recurrence of chest pressure since presentation to the ED. Patient reports history of A flutter for which she takes diltiazem and ASA. She has never been on a blood thinner. She notes brief episodes of palpitations daily which last for seconds to minutes. She denies orthopnea, PND, LE edema, recent illness, or changes in weight.   Hospital course: VSS. Labs notable for K 3.7 today, Mg 2.2, Cr 0.69, Hgb 12.0, PLT 273, Trop <0..03 x3, LDL 91, Ddimer wnl, TSH 0.126, FT4 1.26. EKG with sinus rhythm with submm ST depressions in inferolateral leads. CXR without acute findings; chronic bronchitic changes. Patient received ASA 328m and admitted to medicine. Cardiology consulted for further recommendations.    Past Medical History:  Diagnosis Date  . A-fib ([place]   . Arthritis   . Blood transfusion without reported diagnosis   . COPD [place] disease) [place]   . Hashimoto's disease   . [place]e deficiency disorder ([place]   . Pneumonia   . TIA (transient ischemic attack)     Past Surgical History:  Procedure Laterality Date  . ABDOMINAL HYSTERECTOMY    . APPENDECTOMY    . CATARACT EXTRACTION Bilateral   . CHOLECYSTECTOMY    . TONSILLECTOMY       Home Medications:  Prior to Admission medications   Medication Sig Start Date End Date Taking? Authorizing Provider  ALPRAZolam (  XANAX) 1 MG tablet Take 1 mg by mouth as needed for anxiety.   Yes [provider]  cefUROXime (CEFTIN) 250 MG tablet Take 250 mg by mouth 2 (two) times daily with a meal.   Yes [provider]  diltiazem (DILACOR XR) 180 MG 24 hr capsule Take 180 mg by mouth daily.   Yes [provider]  estradiol (ESTRACE) 0.5 MG tablet Take 0.5  mg by mouth daily.   Yes [provider]  HYDROcodone-acetaminophen (NORCO) 10-325 MG tablet Take 1 tablet by mouth every 6 (six) hours as needed (PRN for headaches).   Yes [provider]  levothyroxine (SYNTHROID, LEVOTHROID) 125 MCG tablet Take 125 mcg by mouth daily before breakfast.   Yes [provider]  tiZANidine (ZANAFLEX) 4 MG tablet Take 4 mg by mouth at bedtime.    Yes [provider]    Inpatient Medications: Scheduled Meds: . diltiazem  180 mg Oral QHS  . enoxaparin (LOVENOX) injection  40 mg Subcutaneous Daily  . [START ON 10/31/2017] levothyroxine  112 mcg Oral QAC breakfast  . tiZANidine  4 mg Oral QHS   Continuous Infusions:  PRN Meds: acetaminophen, ALPRAZolam, gi cocktail, HYDROcodone-acetaminophen, morphine injection, ondansetron (ZOFRAN) IV  Allergies:    Allergies  Allergen Reactions  . Ciprofloxacin Other (See Comments)    hallucinations   . Macrolides And Ketolides Other (See Comments)    Kidney problems  . Sulfa Antibiotics Rash    Social History:   Social History   Socioeconomic History  . Marital status: Widowed    Spouse name: Not on file  . Number of children: Not on file  . Years of education: Not on file  . Highest education level: Not on file  Occupational History  . Not on file  Social Needs  . Financial resource strain: Not on file  . Food insecurity:    Worry: Not on file    Inability: Not on file  . Transportation needs:    Medical: Not on file    Non-medical: Not on file  Tobacco Use  . Smoking status: Former Research scientist (life sciences)  . Smokeless tobacco: Never Used  Substance and Sexual Activity  . Alcohol use: No  . Drug use: No  . Sexual activity: Not on file  Lifestyle  . Physical activity:    Days per week: Not on file    Minutes per session: Not on file  . Stress: Not on file  Relationships  . Social connections:    Talks on phone: Not on file    Gets together: Not on file    Attends religious  service: Not on file    Active member of club or organization: Not on file    Attends meetings of clubs or organizations: Not on file    Relationship status: Not on file  . Intimate partner violence:    Fear of current or ex partner: Not on file    Emotionally abused: Not on file    Physically abused: Not on file    Forced sexual activity: Not on file  Other Topics Concern  . Not on file  Social History Narrative  . Not on file    Family History:    Family History  Problem Relation Age of Onset  . CAD Father 49       multiple MI's, s/p CABG; deceased in 91s     ROS:  Please see the history of present illness.   All other ROS reviewed and  negative.     Physical Exam/Data:   Vitals:   10/29/17 2230 10/29/17 2332 10/30/17 0340 10/30/17 1013  BP: 123/76 (!) 138/55 (!) 109/52 (!) 124/53  Pulse: 81 88 76 80  Resp: 10 20 16 18   Temp:  98 F (36.7 C) 97.6 F (36.4 C) (!) 97.4 F (36.3 C)  TempSrc:  Oral Oral Oral  SpO2: 96% 96% 97% 98%  Weight:  119 lb 11.2 oz (54.3 kg)    Height:  5' 7.5" (1.715 m)      Intake/Output Summary (Last 24 hours) at 10/30/2017 1408 Last data filed at 10/30/2017 1000 Gross per 24 hour  Intake 1360 ml  Output 0 ml  Net 1360 ml   Filed Weights   10/29/17 1804 10/29/17 2332  Weight: 120 lb (54.4 kg) 119 lb 11.2 oz (54.3 kg)   Body mass index is 18.47 kg/m.  General:  Thin female laying in bed in NAD HEENT: sclera anicteric; difficulty keeping eyes open Neck: no JVD Vascular: No carotid bruits; distal pulses 2+ bilaterally Cardiac:  normal S1, S2; RRR; no murmurs, gallops, or rubs Lungs:  clear to auscultation bilaterally, no wheezing, rhonchi or rales  Abd: NABS, soft, nontender, no hepatomegaly Ext: no edema Musculoskeletal:  No deformities, BUE and BLE strength normal and equal Skin: warm and dry  Neuro:  CNs 2-12 intact, no focal abnormalities noted Psych:  Normal affect   EKG:  The EKG was personally reviewed and demonstrates:   Sinus rhythm with submm ST depressions in inferolateral leads (no comparison) Telemetry:  Telemetry was personally reviewed and demonstrates:  NSR  Relevant CV Studies:  Echocardiogram 10/30/2017: Study Conclusions  - Left ventricle: The cavity size was normal. Wall thickness was   normal. Systolic function was normal. The estimated ejection   fraction was in the range of 55% to 60%. Wall motion was normal;   there were no regional wall motion abnormalities. Doppler   parameters are consistent with abnormal left ventricular   relaxation (grade 1 diastolic dysfunction). - Aortic valve: There was mild regurgitation.  Impressions:  - Normal LV systolic function; mild diastolic dysfunction; mild AI.  Laboratory Data:  Chemistry Recent Labs  Lab 10/29/17 1901 10/30/17 0318  NA 140 140  K 3.8 3.7  CL 105 109  CO2 27 25  GLUCOSE 93 92  BUN 17 12  CREATININE 0.82 0.69  CALCIUM 8.5* 7.7*  GFRNONAA >60 >60  GFRAA >60 >60  ANIONGAP 8 6    No results for input(s): PROT, ALBUMIN, AST, ALT, ALKPHOS, BILITOT in the last 168 hours. Hematology Recent Labs  Lab 10/29/17 1901 10/30/17 0318  WBC 6.8 5.5  RBC 4.76 3.95  HGB 14.3 12.0  HCT 42.7 35.5*  MCV 89.7 89.9  MCH 30.0 30.4  MCHC 33.5 33.8  RDW 14.9 14.9  PLT 337 273   Cardiac Enzymes Recent Labs  Lab 10/29/17 1901 10/30/17 0113 10/30/17 0318 10/30/17 0651  TROPONINI <0.03 <0.03 <0.03 <0.03   No results for input(s): TROPIPOC in the last 168 hours.  BNPNo results for input(s): BNP, PROBNP in the last 168 hours.  DDimer  Recent Labs  Lab 10/30/17 0113  DDIMER 0.34    Radiology/Studies:  Dg Chest 2 View  Result Date: 10/29/2017 CLINICAL DATA:  Atrial flutter with weak spells and shortness of breath. EXAM: CHEST - 2 VIEW COMPARISON:  06/26/2017 and 06/19/2016 FINDINGS: The cardiac silhouette, mediastinal and hilar contours are within normal limits and stable. The lungs demonstrate hyperinflation and there  are  chronic appearing bronchitic changes with increased interstitial markings and suspected vague nodularity. The patient at a prior neck CT which showed the lung apices. There was clearly a pattern of centrilobular nodules and probable tree-in-bud appearance suggesting chronic inflammation or possible atypical infection such as MAC. A full chest CT may be helpful for further evaluation. IMPRESSION: Chronic appearing bronchitic type changes with possible chronic inflammation or atypical infection based on the prior CT scan. Recommend chest CT for further evaluation. Electronically Signed   By: Marijo Sanes M.D.   On: 10/29/2017 19:18    Assessment and Plan:   1. Chest pain: Reports chest tightness a/w SOB with exertion. Resolved spontaneously with rest. Troponin negative x3. Ddimer negative. EKG with submm ST depressions in inferolateral leads (no comparison). Echo today with EF 55-60%, G1DD, and no wall motion abnormalities. Last exercise stress test was reportedly >2 years ago and c/b syncope per patient. Risk factors include HLD (on labs this admission), and family history of CAD.  - Would likely benefit from further evaluation with a NST - inpatient vs outpatient - Risk modifications: LDL <70, currently 91 - could consider starting a statin  2. 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 Has never been on [facility]. No evidence of arrhythmias on telemetry. K 3.7, Mg 2.2 - Monitor electrolytes and replete as needed to maintain K >4, Mg >2 - Continue diltiazem - Could consider an event monitor at discharge  3. Hypothyroidism: 2/2 hashimoto's thyroiditis; TSH 0.126, FT4 1.26 - Consider lowering levothyroxine dose  4. COPD: appears at baseline; patient states he does not tolerate breathing treatments - Continue management per primary team  5. Hypogammaglobulinemia: lost to follow-up with oncology; last episode of PNA 06/2017. CXR this admission without acute  findings.  - Continue management per primary team  6. Anxiety: - Continue xanax prn   For questions or updates, please contact [facility] Please consult [email or web address] for contact info under Cardiology/STEMI.   Signed, Abigail Butts, PA-C  10/30/2017 2:08 PM [phone]   I have seen, examined and evaluated the patient this PM along with Abigail Butts, PA-C .  After reviewing all the available data and chart, we discussed the patients laboratory, study & physical findings as well as symptoms in detail. I agree with her findings, examination as well as impression recommendations as per our discussion.    Kimberly Phillips has a very unusual conglomeration of symptoms, she did note some chest discomfort, but for the most part she is just noting fatigue and a general sense of malaise. She initially had been started on diltiazem for what was she was told was atrial flutter however there is also report of SVT.  She was supposed to follow-up in 1 week with the cardiologist in [facility] and unfortunately was not able to because of scheduling issues.  She subsequently ran out of diltiazem and felt significant fatigue and weakness when off of diltiazem, so she went back onto it, and started feeling better until this most recent spell which began a week or so ago.  She denies really feeling any sensation of prolonged rapid heartbeats, but has had some intermittent episodes of bursts of fast heart rates.  She has a general feeling of fatigue and just feeling weak, but did have the episode of exertional chest discomfort and dyspnea yesterday.  Because of the subtle ST depressions she was sent over for evaluation.  I do think it her symptoms are  somewhat concerning just given the strange compilation of symptoms.  With an abnormal EKG, former smoker with family history of CAD, there is at least moderate risk for CAD.  We should evaluate her for possible ischemia with Lexiscan Myoview. We will also be  monitoring her on telemetry to see if there is any abnormal findings.  She is also having ongoing neurologic evaluation  Would continue her diltiazem for her atrial flutter/SVT.  She is indicated that she would like to transfer to a different cardiology group, but would like to be able to stay local in [facility] area.  While this may not be an option at this current time, we should be able to speak staffing her [place] clinic by mid summer time. -->  Initial post hospital follow-up can be either with me or an APP on my team with plans to then have her transferred to the [facility] office for ongoing care.   Glenetta Hew, M.D., M.S. Interventional Cardiologist   Pager # [phone] Phone # [phone] [address] [place], [place] [address]

## 2017-10-30 NOTE — Progress Notes (Signed)
Progress Note    Kimberly Phillips  IRJ:[id number] DOB: 1946/07/03  DOA: 10/29/2017 PCP: Maylon Peppers, MD    Brief Narrative:   Chief complaint: Follow-up chest pressure  Medical records reviewed and are as summarized below:  Kimberly Phillips is an 72 y.o. female with a PMH of SVT/arrhythmia not on blood thinners, syncope, immunodeficiency syndrome, recurrent pneumonia, anxiety, and Hashimoto's thyroiditis who was admitted 10/29/17 for evaluation of chest pressure associated with shortness of breath, palpitations and night sweats. Initial troponin negative but EKG showed ST depression in the inferior leads. Given aspirin and admitted to a telemetry bed for observation.  Assessment/Plan:   Principal Problem:   Chest pain Troponin negative 4. Cardiology consultation pending. Continue aspirin and telemetry monitoring. Patient currently denies chest pressure, but gets short of breath with activity.  Active Problems:   History of cardiac arrhythmia Monitoring on telemetry. Continue Cardizem.    Hypothyroidism due to Hashimoto's thyroiditis TSH low. Free T4 mildly elevated 1.26. Decrease Synthroid to 112.5 g daily. The patient states that she became symptomatic with no energy when her dose was 100 g daily.    Anxiety May the exacerbated by over replacement of thyroid hormone. Synthroid reduced. Continue Xanax as needed.    Immunodeficiency ([place]) Chest x-ray personally reviewed. Lungs are hyperinflated with chronic bronchitic changes. Patient reports that she has chronic scarring in her lungs from repeated bouts of pneumonia. She does not have any evidence of pneumonia clinically. CT scan of the chest not currently indicated.    Fatigue PT evaluation requested.  Body mass index is 18.47 kg/m.   Family Communication/Anticipated D/C date and plan/Code Status   DVT prophylaxis: Lovenox ordered. Code Status: Full Code.  Family Communication: No family currently at the  bedside. Disposition Plan: Hopefully home in the next 24 hours if cleared by cardiology.   Medical Consultants:    Cardiology   Anti-Infectives:    None  Subjective:   Patient denies current chest pressure. Not short of breath while lying in the bed, but reports some exertional dyspnea. No diaphoretic spells.  Objective:    Vitals:   10/29/17 2200 10/29/17 2230 10/29/17 2332 10/30/17 0340  BP: (!) 144/66 123/76 (!) 138/55 (!) 109/52  Pulse: 90 81 88 76  Resp: _0 Temp:   98 F (36.7 C) 97.6 F (36.4 C)  TempSrc:   Oral Oral  SpO2: 99% 96% 96% 97%  Weight:   54.3 kg (119 lb 11.2 oz)   Height:   5' 7.5" (1.715 m)     Intake/Output Summary (Last 24 hours) at 10/30/2017 0842 Last data filed at 10/30/2017 0100 Gross per 24 hour  Intake 1120 ml  Output 0 ml  Net 1120 ml   Filed Weights   10/29/17 1804 10/29/17 2332  Weight: 54.4 kg (120 lb) 54.3 kg (119 lb 11.2 oz)    Exam: General: No acute distress. Cardiovascular: Heart sounds show a regular rate, and rhythm. No gallops or rubs. No murmurs. No JVD. Lungs: Clear to auscultation bilaterally with good air movement. No rales, rhonchi or wheezes. Abdomen: Soft, nontender, nondistended with normal active bowel sounds. No masses. No hepatosplenomegaly. Neurological: Alert and oriented 3. Moves all extremities 4 with equal strength. Cranial nerves II through XII grossly intact. Skin: Warm and dry. No rashes or lesions. Extremities: No clubbing or cyanosis. No edema. Pedal pulses 2+. Psychiatric: Mood and affect are normal. Insight and judgment are normal.   Data Reviewed:  I have personally reviewed following labs and imaging studies:  Labs: Labs show the following:   Basic Metabolic Panel: Recent Labs  Lab 10/29/17 1901 10/30/17 0113 10/30/17 0318  NA 140  --  140  K 3.8  --  3.7  CL 105  --  109  CO2 27  --  25  GLUCOSE 93  --  92  BUN 17  --  12  CREATININE 0.82  --  0.69  CALCIUM 8.5*   --  7.7*  MG  --  2.2  --    GFR Estimated Creatinine Clearance: 54.5 mL/min (by C-G formula based on SCr of 0.69 mg/dL).  CBC: Recent Labs  Lab 10/29/17 1901 10/30/17 0318  WBC 6.8 5.5  NEUTROABS  --  2.9  HGB 14.3 12.0  HCT 42.7 35.5*  MCV 89.7 89.9  PLT 337 273   Cardiac Enzymes: Recent Labs  Lab 10/29/17 1901 10/30/17 0113 10/30/17 0318 10/30/17 0651  TROPONINI <0.03 <0.03 <0.03 <0.03   D-Dimer: Recent Labs    10/30/17 0113  DDIMER 0.34   Lipid Profile: Recent Labs    10/30/17 0318  CHOL 146  HDL 42  LDLCALC 91  TRIG 64  CHOLHDL 3.5   Thyroid function studies: Recent Labs    10/29/17 Sep 11, 2001  TSH 0.126*    Microbiology No results found for this or any previous visit (from the past 240 hour(s)).  Procedures and diagnostic studies:  Dg Chest 2 View  Result Date: 10/29/2017 CLINICAL DATA:  Atrial flutter with weak spells and shortness of breath. EXAM: CHEST - 2 VIEW COMPARISON:  06/26/2017 and 06/19/2016 FINDINGS: The cardiac silhouette, mediastinal and hilar contours are within normal limits and stable. The lungs demonstrate hyperinflation and there are chronic appearing bronchitic changes with increased interstitial markings and suspected vague nodularity. The patient at a prior neck CT which showed the lung apices. There was clearly a pattern of centrilobular nodules and probable tree-in-bud appearance suggesting chronic inflammation or possible atypical infection such as MAC. A full chest CT may be helpful for further evaluation. IMPRESSION: Chronic appearing bronchitic type changes with possible chronic inflammation or atypical infection based on the prior CT scan. Recommend chest CT for further evaluation. Electronically Signed   By: Marijo Sanes M.D.   On: 10/29/2017 19:18    Medications:   . diltiazem  180 mg Oral QHS  . enoxaparin (LOVENOX) injection  40 mg Subcutaneous Daily  . levothyroxine  125 mcg Oral QAC breakfast  . tiZANidine  4 mg Oral  QHS   Continuous Infusions:   LOS: 0 days   Jacquelynn Cree  [facility] Pager [phone]. If unable to reach me by pager, please call my cell phone at [phone].  *Please refer to [email or web address], password [email or web address] to get updated schedule on who will round on this patient, as hospitalists switch teams weekly. If 7PM-7AM, please contact night-coverage at [email or web address], password [email or web address] for any overnight needs.  10/30/2017, 8:42 AM

## 2017-10-30 NOTE — Evaluation (Signed)
Physical Therapy Evaluation Patient Details Name: Kimberly Phillips MRN: [id number] DOB: 10/22/1945 Today's Date: 10/30/2017   History of Present Illness  Pt. is a 72 y.o. F with significant PMH of SVT/arrhythmia, syncope, immunodeficiency, recurrent pneumonia, anxiety, and Hashimoto's thryoiditis; who presents with complaints of short intermittent episodes of chest pressure. Over last week has felt weaker, unable to keep her eyes open and stumbling.   Clinical Impression  Patient presenting with decreased functional mobility compared to baseline secondary to decreased endurance, diminished balance, and gait deviations. Patient with interesting presentation and very difficult to obtain accurate timeframe of chronic vs acute symptoms. Patient states she has chronic back pain and history of syncopal episodes, where when she is driving she will have to close her eyes and may run off the road; she attributes these episodes to her "TIAs" but then says her neurologist attributes them to migraines. She states the imbalance and dyspnea are new within the past week, but then begins to describe a previous episode 3 years ago that she claims was due to her atrial flutter medication change. Patient denies visual changes or dizziness with movement, although does report dizziness when assessing saccades. Upon PT evaluation, patient independent with transfers and ambulates 40 feet with supervision and no device. Unable to ambulate further due to diminished endurance and fatigue. Supervision required for patient slight unsteadiness and scissoring gait. Will continue to follow with acute PT for balance and stair training. Although patient and patient daughter stating they have no concerns for navigating stairs in the home. Recommending HHPT.     Follow Up Recommendations Home health PT;Supervision for mobility/OOB    Equipment Recommendations  None recommended by PT    Recommendations for Other Services       Precautions  / Restrictions Precautions Precautions: Fall      Mobility  Bed Mobility Overal bed mobility: Independent                Transfers Overall transfer level: Independent Equipment used: None                Ambulation/Gait Ambulation/Gait assistance: Supervision Ambulation Distance (Feet): 40 Feet Assistive device: None Gait Pattern/deviations: Scissoring;Narrow base of support;Step-through pattern Gait velocity: decreased   General Gait Details: Patient with very narrow BOS, occasionally scissoring gait and intermittently reaching for wall railing. C/o of shortness of breath, SpO2 98% on RA.   Stairs            Wheelchair Mobility    Modified Rankin (Stroke Patients Only)       Balance Overall balance assessment: Needs assistance Sitting-balance support: No upper extremity supported;Feet supported Sitting balance-Leahy Scale: Normal     Standing balance support: No upper extremity supported;During functional activity Standing balance-Leahy Scale: Fair Standing balance comment: Supervision for dynamic balance and patient intermittently reaching for wall railing to steady                             Pertinent Vitals/Pain Pain Assessment: Faces Faces Pain Scale: Hurts little more Pain Location: Back Pain Descriptors / Indicators: Grimacing Pain Intervention(s): Limited activity within patient's tolerance;Monitored during session;Heat applied    Home Living Family/patient expects to be discharged to:: Private residence Living Arrangements: Children Available Help at Discharge: Family Type of Home: House Home Access: Stairs to enter Entrance Stairs-Rails: Can reach both Entrance Stairs-Number of Steps: [place]: Two level;Able to live on main level with bedroom/bathroom Home Equipment: None  Prior Function Level of Independence: Independent         Comments: Pharmacy tech     Hand Dominance         Extremity/Trunk Assessment   Upper Extremity Assessment Upper Extremity Assessment: Overall WFL for tasks assessed    Lower Extremity Assessment Lower Extremity Assessment: Overall WFL for tasks assessed       Communication   Communication: No difficulties  Cognition Arousal/Alertness: Lethargic Behavior During Therapy: WFL for tasks assessed/performed Overall Cognitive Status: Within Functional Limits for tasks assessed                                        General Comments General comments (skin integrity, edema, etc.): Patient daughter present during session    Exercises     Assessment/Plan    PT Assessment Patient needs continued PT services  PT Problem List Decreased balance;Decreased mobility;Pain;Decreased activity tolerance       PT Treatment Interventions DME instruction;Gait training;Stair training;Functional mobility training;Therapeutic activities;Therapeutic exercise;Balance training;Patient/family education    PT Goals (Current goals can be found in the Care Plan section)  Acute Rehab PT Goals Patient Stated Goal: Be able to eat PT Goal Formulation: With patient Time For Goal Achievement: 11/13/17 Potential to Achieve Goals: Good    Frequency Min 3X/week   Barriers to discharge        Co-evaluation               AM-PAC PT "6 Clicks" Daily Activity  Outcome Measure Difficulty turning over in bed (including adjusting bedclothes, sheets and blankets)?: None Difficulty moving from lying on back to sitting on the side of the bed? : None Difficulty sitting down on and standing up from a chair with arms (e.g., wheelchair, bedside commode, etc,.)?: None Help needed moving to and from a bed to chair (including a wheelchair)?: None Help needed walking in hospital room?: A Little Help needed climbing 3-5 steps with a railing? : A Little 6 Click Score: 22    End of Session Equipment Utilized During Treatment: Gait belt Activity  Tolerance: Patient limited by fatigue Patient left: with call bell/phone within reach;with family/visitor present Nurse Communication: Mobility status PT Visit Diagnosis: Unsteadiness on feet (R26.81);Dizziness and giddiness (R42);Difficulty in walking, not elsewhere classified (R26.2)    Time: 1240-1300 PT Time Calculation (min) (ACUTE ONLY): 20 min   Charges:   PT Evaluation $PT Eval Low Complexity: 1 Low     PT G Codes:        Ellamae Sia, PT, DPT Acute Rehabilitation Services  Pager: [place] 10/30/2017, 3:13 PM

## 2017-10-30 NOTE — Progress Notes (Signed)
  Echocardiogram 2D Echocardiogram has been performed.  Kimberly Phillips 10/30/2017, 2:39 PM

## 2017-10-30 NOTE — H&P (Signed)
History and Physical    Kimberly Phillips EQA:[id number] DOB: 06-11-1946 DOA: 10/29/2017  Referring MD/NP/PA: Dr. Blaine Hamper PCP: Maylon Peppers, MD  Patient coming from: [facility] transfer  Chief Complaint: Chest pressure  I have personally briefly reviewed patient's old medical records in [place]   HPI: Kimberly Phillips is a 72 y.o. 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  Pressure symptoms did not radiate to her jaw or arm.  The chest pressure was mild and reports associated symptoms of some shortness of breath, palpitations, and night sweats.  Over the last 1 week she has felt weaker unable to keep her eyes open and stumbling.  She had been being evaluated by neurology, and had a follow-up appointment 2 days ago for excessive daytime sleepiness and migraine headaches.  Her neurologist as scheduled or have thyroid testing and EEG study.  Denies having any significant falls, fever, chills, nausea, and vomiting.  She is not on any medication or follow-up by anyone currently at this time for her history of immune of deficiency.  She takes her levothyroxine as prescribed for history of Hashimoto's thyroiditis.  Patient notes that her daughters have this as well.  ED Course: Patient was found to have vital signs maintained on room air.  Patient had a negative initial troponin, but EKG showed ST depression in the inferior leads. WBC 6.8 and all other labs are relatively within normal limits.  Chest x-ray showed chronic bronchitic change to be related to previous history of pneumonias..  Aspirin 325 mg was given, and patient accepted to a telemetry bed here at [facility].   Review of Systems  Constitutional: Positive for diaphoresis and malaise/fatigue. Negative for chills, fever  and weight loss.  HENT: Negative for ear pain and nosebleeds.   Eyes: Negative for double vision and pain.  Respiratory: Positive for shortness of breath. Negative for sputum production.   Cardiovascular: Negative for leg swelling.  Gastrointestinal: Negative for abdominal pain, nausea and vomiting.  Genitourinary: Negative for dysuria and frequency.  Musculoskeletal: Negative for falls.  Skin: Negative for itching and rash.  Neurological: Positive for headaches. Negative for speech change and weakness.  Psychiatric/Behavioral: Negative for depression and substance abuse.    Past Medical History:  Diagnosis Date  . A-fib ([place])   . Arthritis   . Blood transfusion without reported diagnosis   . COPD (chronic obstructive pulmonary disease) ([place])   . Hashimoto's disease   . Immune deficiency disorder ([place])   . Pneumonia   . TIA (transient ischemic attack)     Past Surgical History:  Procedure Laterality Date  . ABDOMINAL HYSTERECTOMY    . APPENDECTOMY    . CATARACT EXTRACTION Bilateral   . CHOLECYSTECTOMY    . TONSILLECTOMY       reports that she has quit smoking. She has never used smokeless tobacco. She reports that she does not drink alcohol or use drugs.  Allergies  Allergen Reactions  . Ciprofloxacin Other (See Comments)    hallucinations   . Macrolides And Ketolides Other (See Comments)    Kidney problems  . Sulfa Antibiotics Rash    History reviewed.  Patient reports daughters have Hashimoto's thyroiditis as well.  Prior to Admission medications   Medication Sig Start Date End Date Taking? Authorizing Provider  ALPRAZolam Duanne Moron) 1 MG tablet Take 1 mg by mouth as  needed for anxiety.   Yes [provider]  cefUROXime (CEFTIN) 250 MG tablet Take 250 mg by mouth 2 (two) times daily with a meal.   Yes [provider]  diltiazem (DILACOR XR) 180 MG 24 hr capsule Take 180 mg by mouth daily.   Yes [provider]  estradiol (ESTRACE) 0.5 MG  tablet Take 0.5 mg by mouth daily.   Yes [provider]  HYDROcodone-acetaminophen (NORCO) 10-325 MG tablet Take 1 tablet by mouth every 6 (six) hours as needed (PRN for headaches).   Yes [provider]  levothyroxine (SYNTHROID, LEVOTHROID) 125 MCG tablet Take 125 mcg by mouth daily before breakfast.   Yes [provider]    Physical Exam:  Constitutional: Elderly female in NAD, calm, comfortable Vitals:   10/29/17 2153 10/29/17 2200 10/29/17 2230 10/29/17 2332  BP: 135/65 (!) 144/66 123/76 (!) 138/55  Pulse: 93 90 81 88  Resp: 13 13 10 20   Temp:    98 F (36.7 C)  TempSrc:    Oral  SpO2: 99% 99% 96% 96%  Weight:    54.3 kg (119 lb 11.2 oz)  Height:    5' 7.5" (1.715 m)   Eyes: PERRL, lids and conjunctivae normal ENMT: Mucous membranes are moist. Posterior pharynx clear of any exudate or lesions.  Normal dentition.  Neck: normal, supple, no masses, no thyromegaly Respiratory: clear to auscultation bilaterally, no wheezing, no crackles. Normal respiratory effort. No accessory muscle use.  Cardiovascular: Regular rate and rhythm, no murmurs / rubs / gallops. No extremity edema. 2+ pedal pulses. No carotid bruits.  Abdomen: no tenderness, no masses palpated. No hepatosplenomegaly. Bowel sounds positive.  Musculoskeletal: no clubbing / cyanosis. No joint deformity upper and lower extremities. Good ROM, no contractures. Normal muscle tone.  Skin: no rashes, lesions, ulcers. No induration Neurologic: CN 2-12 grossly intact. Sensation intact, DTR normal. Strength 5/5 in all 4.  Psychiatric: Normal judgment and insight. Alert and oriented x 3. Normal mood.     Labs on Admission: I have personally reviewed following labs and imaging studies  CBC: Recent Labs  Lab 10/29/17 1901  WBC 6.8  HGB 14.3  HCT 42.7  MCV 89.7  PLT 161   Basic Metabolic Panel: Recent Labs  Lab 10/29/17 1901  NA 140  K 3.8  CL 105  CO2 27  GLUCOSE 93  BUN 17  CREATININE  0.82  CALCIUM 8.5*   GFR: Estimated Creatinine Clearance: 53.2 mL/min (by C-G formula based on SCr of 0.82 mg/dL). Liver Function Tests: No results for input(s): AST, ALT, ALKPHOS, BILITOT, PROT, ALBUMIN in the last 168 hours. No results for input(s): LIPASE, AMYLASE in the last 168 hours. No results for input(s): AMMONIA in the last 168 hours. Coagulation Profile: No results for input(s): INR, PROTIME in the last 168 hours. Cardiac Enzymes: Recent Labs  Lab 10/29/17 1901  TROPONINI <0.03   BNP (last 3 results) No results for input(s): PROBNP in the last 8760 hours. HbA1C: No results for input(s): HGBA1C in the last 72 hours. CBG: No results for input(s): GLUCAP in the last 168 hours. Lipid Profile: No results for input(s): CHOL, HDL, LDLCALC, TRIG, CHOLHDL, LDLDIRECT in the last 72 hours. Thyroid Function Tests: Recent Labs    10/29/17 2003  TSH 0.126*  FREET4 1.26*   Anemia Panel: No results for input(s): VITAMINB12, FOLATE, FERRITIN, TIBC, IRON, RETICCTPCT in the last 72 hours. Urine analysis: No results found for: COLORURINE, APPEARANCEUR, [place], [place], [place], [place], [place], [place],  PROTEINUR, UROBILINOGEN, NITRITE, LEUKOCYTESUR Sepsis Labs: No results found for this or any previous visit (from the past 240 hour(s)).   Radiological Exams on Admission: Dg Chest 2 View  Result Date: 10/29/2017 CLINICAL DATA:  Atrial flutter with weak spells and shortness of breath. EXAM: CHEST - 2 VIEW COMPARISON:  06/26/2017 and 06/19/2016 FINDINGS: The cardiac silhouette, mediastinal and hilar contours are within normal limits and stable. The lungs demonstrate hyperinflation and there are chronic appearing bronchitic changes with increased interstitial markings and suspected vague nodularity. The patient at a prior neck CT which showed the lung apices. There was clearly a pattern of centrilobular nodules and probable tree-in-bud appearance suggesting chronic inflammation  or possible atypical infection such as MAC. A full chest CT may be helpful for further evaluation. IMPRESSION: Chronic appearing bronchitic type changes with possible chronic inflammation or atypical infection based on the prior CT scan. Recommend chest CT for further evaluation. Electronically Signed   By: Marijo Sanes M.D.   On: 10/29/2017 19:18    EKG: Independently reviewed.  Sinus rhythm at 96 bpm  Assessment/Plan Chest pain/pressure: Acute.  Patient presents with complaints of chest pain and shortness of breath.  Initial troponin was noted to be negative, but EKG shows ST depressions in the inferior leads. - Admit to telemetry bed - Check d-dimer - Trend cardiac troponin every 3 hours x3 - Recommend patient not take estradiol as it puts patient at risk for blood clots - Check echocardiogram - Message sent for cardiology to evaluate in a.m.  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 - Consider need of adjustment to levothyroxine  COPD: Patient reports having some shortness of breath symptoms.  Chest x-ray otherwise showed chronic changes thought likely related to previous history of pneumonias.  Patient reports being unable to tolerate breathing treatments. - Mucinex  Fatigue: Question if patient's symptoms could be caused by other autoimmune disorders such as myasthenia gravis given patient's symptoms.  May consider starting with ice pack test. - May warrant further investigation  Anxiety - Continue Xanax prn anxiety  Immunodeficiency: Patient has not followed by oncology for this issue at this time.  Previously reports being tried on replacement infusion  therapy, but patient reports not tolerating these well.   DVT prophylaxis: lovenox  Code Status: full  Family Communication: No family present at bedside Disposition Plan: Likely discharge home if workup negative Consults called: none Admission status: observation  Norval Morton MD [facility] Pager [phone]   If 7PM-7AM, please contact night-coverage [email or web address] Password [facility]  10/30/2017, 12:16 AM

## 2017-10-31 ENCOUNTER — Observation Stay (HOSPITAL_BASED_OUTPATIENT_CLINIC_OR_DEPARTMENT_OTHER): Payer: Medicare Other

## 2017-10-31 DIAGNOSIS — R079 Chest pain, unspecified: Secondary | ICD-10-CM

## 2017-10-31 DIAGNOSIS — R0789 Other chest pain: Secondary | ICD-10-CM

## 2017-10-31 DIAGNOSIS — R072 Precordial pain: Secondary | ICD-10-CM

## 2017-10-31 DIAGNOSIS — I471 Supraventricular tachycardia: Secondary | ICD-10-CM

## 2017-10-31 LAB — NM MYOCAR MULTI W/SPECT W/WALL MOTION / EF
CSEPED: 0 min
CSEPEDS: 0 s
CSEPEW: 1 METS
CSEPPHR: 110 {beats}/min
MPHR: 148 {beats}/min
Percent HR: 74 %
Rest HR: 81 {beats}/min

## 2017-10-31 MED ORDER — TECHNETIUM TC 99M TETROFOSMIN IV KIT
10.0000 | PACK | Freq: Once | INTRAVENOUS | Status: AC | PRN
  Administered 2017-10-31: 10 via INTRAVENOUS

## 2017-10-31 MED ORDER — LEVOTHYROXINE SODIUM 112 MCG PO TABS: 112.0000 ug | ORAL_TABLET | Freq: Every day | ORAL | 0 refills | Status: DC

## 2017-10-31 MED ORDER — REGADENOSON 0.4 MG/5ML IV SOLN
INTRAVENOUS | Status: AC
  Administered 2017-10-31: 10:00:00
  Filled 2017-10-31: qty 5

## 2017-10-31 MED ORDER — REGADENOSON 0.4 MG/5ML IV SOLN
0.4000 mg | Freq: Once | INTRAVENOUS | Status: AC
  Administered 2017-10-31: 0.4 mg via INTRAVENOUS
  Filled 2017-10-31: qty 5

## 2017-10-31 MED ORDER — TECHNETIUM TC 99M TETROFOSMIN IV KIT
30.0000 | PACK | Freq: Once | INTRAVENOUS | Status: AC | PRN
  Administered 2017-10-31: 30 via INTRAVENOUS

## 2017-10-31 NOTE — Progress Notes (Signed)
Progress Note  Patient Name: Kimberly Phillips Date of Encounter: 10/31/2017  Primary Cardiologist: No primary care provider on file.   Subjective   Becomes easily dyspneic with light activity, but has not had any further chest pain. Just returned from nuclear stress test. Had a 10-11 beat episode of paroxysmal atrial tachycardia earlier, no other significant arrhythmia on overnight telemetry  Inpatient Medications    Scheduled Meds: . diltiazem  180 mg Oral QHS  . enoxaparin (LOVENOX) injection  40 mg Subcutaneous Daily  . levothyroxine  112 mcg Oral QAC breakfast  . tiZANidine  4 mg Oral QHS   Continuous Infusions:  PRN Meds: acetaminophen, ALPRAZolam, gi cocktail, HYDROcodone-acetaminophen, morphine injection, ondansetron (ZOFRAN) IV   Vital Signs    Vitals:   10/31/17 0844 10/31/17 0933 10/31/17 0935 10/31/17 0937  BP: 134/81 (!) 141/89 (!) 153/73 (!) 149/80  Pulse: 81 (!) 104 (!) 107 (!) 104  Resp:      Temp:      TempSrc:      SpO2:      Weight:      Height:        Intake/Output Summary (Last 24 hours) at 10/31/2017 1052 Last data filed at 10/31/2017 0000 Gross per 24 hour  Intake 118 ml  Output 500 ml  Net -382 ml   Filed Weights   10/29/17 1804 10/29/17 2332 10/31/17 0439  Weight: 120 lb (54.4 kg) 119 lb 11.2 oz (54.3 kg) 117 lb (53.1 kg)    Telemetry    Mostly sinus rhythm, a single 11 beat run of PAT (distinct P waves are seen, 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 GEN: No acute distress.   Neck: No JVD Cardiac: RRR, no murmurs, rubs, or gallops.  Respiratory:  bilateral wheezing and pleural rubs GI: Soft, nontender, non-distended  MS: No edema; No deformity. Neuro:  Nonfocal  Psych: Normal affect   Labs    Chemistry Recent Labs  Lab 10/29/17 1901  10/30/17 0318  NA 140 140  K 3.8 3.7  CL 105 109  CO2 27 25  GLUCOSE 93 92  BUN 17 12  CREATININE 0.82 0.69  CALCIUM 8.5* 7.7*  GFRNONAA >60 >60  GFRAA >60 >60  ANIONGAP 8 6     Hematology Recent Labs  Lab 10/29/17 1901 10/30/17 0318  WBC 6.8 5.5  RBC 4.76 3.95  HGB 14.3 12.0  HCT 42.7 35.5*  MCV 89.7 89.9  MCH 30.0 30.4  MCHC 33.5 33.8  RDW 14.9 14.9  PLT 337 273    Cardiac Enzymes Recent Labs  Lab 10/29/17 1901 10/30/17 0113 10/30/17 0318 10/30/17 0651  TROPONINI <0.03 <0.03 <0.03 <0.03   No results for input(s): TROPIPOC in the last 168 hours.   BNPNo results for input(s): BNP, PROBNP in the last 168 hours.   DDimer  Recent Labs  Lab 10/30/17 0113  DDIMER 0.34     Radiology    Dg Chest 2 View  Result Date: 10/29/2017 CLINICAL DATA:  Atrial flutter with weak spells and shortness of breath. EXAM: CHEST - 2 VIEW COMPARISON:  06/26/2017 and 06/19/2016 FINDINGS: The cardiac silhouette, mediastinal and hilar contours are within normal limits and stable. The lungs demonstrate hyperinflation and there are chronic appearing bronchitic changes with increased interstitial markings and suspected vague nodularity. The patient  at a prior neck CT which showed the lung apices. There was clearly a pattern of centrilobular nodules and probable tree-in-bud appearance suggesting chronic inflammation or possible atypical infection such as MAC. A full chest CT may be helpful for further evaluation. IMPRESSION: Chronic appearing bronchitic type changes with possible chronic inflammation or atypical infection based on the prior CT scan. Recommend chest CT for further evaluation. Electronically Signed   By: Marijo Sanes M.D.   On: 10/29/2017 19:18    Cardiac Studies   ECHO 10/30/2017  - Left ventricle: The cavity size was normal. Wall thickness was   normal. Systolic function was normal. The estimated ejection   fraction was in the range of 55% to 60%. Wall motion was  normal;   there were no regional wall motion abnormalities. Doppler   parameters are consistent with abnormal left ventricular   relaxation (grade 1 diastolic dysfunction). - Aortic valve: There was mild regurgitation.  Impressions:  - Normal LV systolic function; mild diastolic dysfunction; mild AI.  Patient Profile     72 y.o. female with history of supraventricular tachycardia, remote TIA, hypogammaglobulinemia and recurrent pneumonia, chronic lung disease admitted with shortness of breath episodic chest pressure at rest.  Assessment & Plan    Suspect that her complaints are primarily related to lung disease.  Echo showed reassuring findings.  Waiting for results of nuclear stress test.  If she has a low risk nuclear perfusion study, did not plan additional cardiac workup at this time.  Her diltiazem seems to be providing satisfactory, if not perfect control of her arrhythmia. The single episode of SVT seen on telemetry is not consistent with usual AV node reentry.  It more likely represents ectopic atrial tachycardia, together with her prominent P waves in lead II, 3, aVF, this would suggest chronic lung disorder is a likely cause.  For questions or updates, please contact [place] Please consult [email or web address] for contact info under Cardiology/STEMI.      Signed, Sanda Klein, MD  10/31/2017, 10:52 AM

## 2017-10-31 NOTE — Discharge Summary (Signed)
Kimberly Phillips, is a 72 y.o. female  DOB 1945/10/10  MRN [id number].  Admission date:  10/29/2017  Admitting Physician  Norval Morton, MD  Discharge Date:  10/31/2017   Primary MD  Maylon Peppers, MD  Recommendations for primary care physician for things to follow:  F/U on TFT's LFT's, and consider statin Official?final Stress Test report Admission Diagnosis  Chest pain, unspecified type [R07.9]   Discharge Diagnosis  Chest pain, unspecified type [R07.9]    Principal Problem:   Chest pain Active Problems:   History of cardiac arrhythmia   Hypothyroidism due to Hashimoto's thyroiditis   Anxiety   Immunodeficiency ([place])   Fatigue      Past Medical History:  Diagnosis Date  . A-fib ([place])   . Arthritis   . Blood transfusion without reported diagnosis   . COPD (chronic obstructive pulmonary disease) ([place])   . Hashimoto's disease   . Immune deficiency disorder ([place])   . Pneumonia   . TIA (transient ischemic attack)     Past Surgical History:  Procedure Laterality Date  . ABDOMINAL HYSTERECTOMY    . APPENDECTOMY    . CATARACT EXTRACTION Bilateral   . CHOLECYSTECTOMY    . TONSILLECTOMY         HPI  from the history and physical done on the day of admission:     Kimberly Phillips is a 72 y.o. 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  Pressure symptoms did not radiate to her jaw or arm.  The chest pressure was mild and reports associated symptoms of some shortness of breath, palpitations, and night sweats.  Over the last 1 week she has felt weaker unable to keep her eyes open and stumbling.  She had been being evaluated by neurology, and had a follow-up appointment 2  days ago for excessive daytime sleepiness and migraine headaches.  Her neurologist as scheduled or have thyroid testing and EEG study.  Denies having any significant falls, fever, chills, nausea, and vomiting.  She is not on any medication or follow-up by anyone currently at this time for her history of immune of deficiency.  She takes her levothyroxine as prescribed for history of Hashimoto's thyroiditis.  Patient notes that her daughters have this as well.  ED Course: Patient was found to have vital signs maintained on room air.  Patient had a negative initial troponin, but EKG showed ST depression in the inferior leads. WBC 6.8 and all other labs are relatively within normal limits.  Chest x-ray showed chronic bronchitic change to be related to previous history of pneumonias..  Aspirin 325 mg was given, and patient accepted to a telemetry bed here at [facility].       Hospital Course:   Kimberly Phillips is an 72 y.o. female with past medical history significant for but not limited to SVT/arrhythmia not on blood thinners, prior syncope,  immunodeficiency syndrome, recurrent pneumonia, anxiety, and Hashimoto's thyroiditis who was admitted 10/29/17 for evaluation of chest pressure associated with shortness of breath, palpitations and night sweats. Initial troponin negative but EKG showed ST depression in the inferior leads. Given aspirin and admitted to a telemetry bed for observation.    Chest pain Resolved spontaneously.   D-dimer and troponin negative 4. 2D echocardiogram EF 55-60% with grade 1 diastolic dysfunction without regional wall motion mellitus. Continued aspirin with telemetry monitoring without any malignant arrythmias. Cardiology consulted and recommended risk factor modification of LDL less than 70 with statin and to get stress test which was completed today, and results was said to be negative per cardiology.    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 K 3.7, Mg 2.2 - Monitor electrolytes and replete as needed to maintain K >4, Mg >2 - Continuediltiazem - Could consider an event monitor at discharge       Hypothyroidism due to Hashimoto's thyroiditis TSH low. Free T4 mildly elevated 1.26. Decrease Synthroid to 112.5 g daily. The patient states that she became symptomatic with no energy when her dose was 100 g daily.    Anxiety May the exacerbated by over replacement of thyroid hormone. Synthroid reduced. Continue Xanax as needed.        Discharge Condition:stable  Follow UP  Follow-up Information    Lelon Perla, MD Follow up.   Specialty:  Cardiology Why:  cardiology office will contact you in 3 business days to schedule an appt.  Contact information: [address] [place] [address] [phone]        Lelon Perla, MD Follow up.   Specialty:  Cardiology Why:  after the next office visit at [facility] clinic in [place], will need outpatient followup with Dr. Stanford Breed at [facility]. Cardiology scheduler will try to arrange.  Contact information: [address] [place] [address] [phone]            Consults obtained - Cardiology  Diet and Activity recommendation:  As advised  Discharge Instructions    Discharge Instructions    Call MD for:  difficulty breathing, headache or visual disturbances   Complete by:  As directed    Call MD for:  extreme fatigue   Complete by:  As directed    Call MD for:  hives   Complete by:  As directed    Call MD for:  persistant dizziness or light-headedness   Complete by:  As directed    Call MD for:  persistant nausea and vomiting   Complete by:  As directed    Call MD for:  redness, tenderness, or signs of infection (pain, swelling, redness, odor or green/yellow discharge around incision site)   Complete by:  As directed     Call MD for:  severe uncontrolled pain   Complete by:  As directed    Call MD for:  temperature >100.4   Complete by:  As directed    Diet - low sodium heart healthy   Complete by:  As directed    Increase activity slowly   Complete by:  As directed         Discharge Medications     Allergies as of 10/31/2017      Reactions   Codeine Itching   Gamma Globulin [immune Globulin] Nausea Only   Levofloxacin Nausea And Vomiting  Moxifloxacin Other (See Comments)   dizziness   Piperacillin-tazobactam In Dex Other (See Comments)    Red Man Syndrome (ALLERGY) Within minutes of getting Zosyn, pt flushed, red all over and buring sensation.   Ciprofloxacin Other (See Comments)   hallucinations   Liothyronine Other (See Comments)   dizziness   Macrolides And Ketolides Other (See Comments)   Kidney problems   Tape Other (See Comments)   Skin tears   Telbivudine Other (See Comments)   Caused Kidney problems   Troleandomycin Other (See Comments)   Kidney problems   Amoxicillin-pot Clavulanate Diarrhea   Has patient had a PCN reaction causing immediate rash, facial/tongue/throat swelling, SOB or lightheadedness with hypotension: No Has patient had a PCN reaction causing severe rash involving mucus membranes or skin necrosis: No Has patient had a PCN reaction that required hospitalization: Unknown Has patient had a PCN reaction occurring within the last 10 years: Unknown If all of the above answers are "NO", then may proceed with Cephalosporin use.   Atenolol Nausea Only   Nitrofurantoin Nausea Only   Sulfa Antibiotics Rash      Medication List    STOP taking these medications   aspirin EC 81 MG tablet   HYDROcodone-acetaminophen 10-325 MG tablet Commonly known as:  NORCO     TAKE these medications   ALPRAZolam 1 MG tablet Commonly known as:  XANAX Take 1 mg by mouth at bedtime.   CALCIUM 600-D PO Take 1,200 mg by mouth daily with lunch.   diltiazem 180 MG 24 hr  capsule Commonly known as:  CARDIZEM CD Take 180 mg by mouth at bedtime.   estradiol 0.5 MG tablet Commonly known as:  ESTRACE Take 0.5 mg by mouth daily with lunch.   FOLBEE PLUS Tabs Take 1 tablet by mouth daily with lunch.   levothyroxine 112 MCG tablet Commonly known as:  SYNTHROID, LEVOTHROID Take 1 tablet (112 mcg total) by mouth daily before breakfast. Start taking on:  11/01/2017 What changed:    medication strength  how much to take   magnesium oxide 400 MG tablet Commonly known as:  MAG-OX Take 400 mg by mouth at bedtime.   tiZANidine 4 MG tablet Commonly known as:  ZANAFLEX Take 4 mg by mouth at bedtime.   VASCULERA Tabs Take 630 mg by mouth at bedtime. For spasms/cramps in feet and legs   ZINC PO Take 400 mg by mouth daily with lunch.       Major procedures and Radiology Reports - PLEASE review detailed and final reports for all details, in brief -     Dg Chest 2 View  Result Date: 10/29/2017 CLINICAL DATA:  Atrial flutter with weak spells and shortness of breath. EXAM: CHEST - 2 VIEW COMPARISON:  06/26/2017 and 06/19/2016 FINDINGS: The cardiac silhouette, mediastinal and hilar contours are within normal limits and stable. The lungs demonstrate hyperinflation and there are chronic appearing bronchitic changes with increased interstitial markings and suspected vague nodularity. The patient at a prior neck CT which showed the lung apices. There was clearly a pattern of centrilobular nodules and probable tree-in-bud appearance suggesting chronic inflammation or possible atypical infection such as MAC. A full chest CT may be helpful for further evaluation. IMPRESSION: Chronic appearing bronchitic type changes with possible chronic inflammation or atypical infection based on the prior CT scan. Recommend chest CT for further evaluation. Electronically Signed   By: Marijo Sanes M.D.   On: 10/29/2017 19:18   Nm Myocar Multi W/spect W/wall Motion /  Ef  Addendum  Date: 10/31/2017    There was no ST segment deviation noted during stress.  No T wave inversion was noted during stress.  Defect 1: There is a medium defect of mild severity present in the mid anterior location.  The study is normal.  This is a low risk study.  Nuclear stress EF: 67%.  The left ventricular ejection fraction is normal (55-65%).  Low risk stress nuclear study with normal perfusion and normal left ventricular regional and global systolic function. There is a prominent reduction in perfusion in the apical anterior wall, with sparing of the true apex and septum, nonreversible and associated with normal wall motion. This is consistent with breast attenuation artifact.   Result Date: 10/31/2017  There was no ST segment deviation noted during stress.  No T wave inversion was noted during stress.  Defect 1: There is a medium defect of mild severity present in the mid anterior location.  The study is normal.  This is a low risk study.  Low risk stress nuclear study with normal perfusion and normal left ventricular regional and global systolic function. There is a prominent reduction in perfusion in the apical anterior wall, with sparing of the true apex and septum, nonreversible and associated with normal wall motion. This is consistent with breast attenuation artifact.    Micro Results    No results found for this or any previous visit (from the past 240 hour(s)).    Patient has been seen and examined prior to discharge  SEE PROGRESS NOTES DONE EARLIER FOR DOS 10/31/2017    Data Review   CBC w Diff:  Lab Results  Component Value Date   WBC 5.5 10/30/2017   HGB 12.0 10/30/2017   HCT 35.5 (L) 10/30/2017   PLT 273 10/30/2017   LYMPHOPCT 34 10/30/2017   MONOPCT 9 10/30/2017   EOSPCT 4 10/30/2017   BASOPCT 1 10/30/2017    CMP:  Lab Results  Component Value Date   NA 140 10/30/2017   K 3.7 10/30/2017   CL 109 10/30/2017   CO2 25 10/30/2017   BUN 12 10/30/2017    CREATININE 0.69 10/30/2017  .   Total Discharge time is about 33 minutes  Benito Mccreedy M.D on 10/31/2017 at 5:50 PM  [facility]   Office  [phone]  Dragon dictation system was used to create this note, attempts have been made to correct errors, however presence of uncorrected errors is not a reflection quality of care provided

## 2017-10-31 NOTE — Progress Notes (Signed)
Pt arrives back from stress test, diet order requested from MD.

## 2017-10-31 NOTE — Progress Notes (Signed)
Page to Dr Vista Lawman   3e22 Nine cleared by cardiology, asked if she would be d/c today.

## 2017-10-31 NOTE — Progress Notes (Signed)
Patient and hospitalist informed of normal stress test. Patient expressed wish to followup with [facility] Dr. Stanford Breed at [facility] due to close proximity. Will send staff message to arrange office visit at [facility] in 2 weeks and 3 month visit with Dr. Stanford Breed at [facility].   Hilbert Corrigan PA Pager: [phone]

## 2017-10-31 NOTE — Progress Notes (Signed)
Page to MD to request diet order post-stress test.

## 2017-10-31 NOTE — Progress Notes (Signed)
Pt admits that she did report SOB to PT and MD, but did not realize she failed to mention that is her baseline. Pt states she already sees pulmonology for these problems, and there is currently no deviation from her baseline in regard to respiratory health.

## 2017-10-31 NOTE — Progress Notes (Signed)
1 day lexiscan myoview completed, pending result by [facility] reader  Signed, Almyra Deforest PA Pager: [phone]

## 2017-10-31 NOTE — Progress Notes (Signed)
Pt gone to stress test.

## 2017-10-31 NOTE — Progress Notes (Signed)
Call placed to [facility] to notify of telemetry monitoring d/c.   

## 2017-10-31 NOTE — Progress Notes (Signed)
Page to MD   3e22 Kimberly Phillips pt denies any SOB further from her baseline with her respiratory history; expresses strong desire for discharge today.

## 2017-10-31 NOTE — Progress Notes (Signed)
Progress Note    Kimberly Phillips  IRW:[id number] DOB: 1946/02/07  DOA: 10/29/2017 PCP: Maylon Peppers, MD    Brief Narrative:    Kimberly Phillips is an 72 y.o. female with past medical history significant for but not limited to SVT/arrhythmia not on blood thinners, prior syncope, immunodeficiency syndrome, recurrent pneumonia, anxiety, and Hashimoto's thyroiditis who was admitted 10/29/17 for evaluation of chest pressure associated with shortness of breath, palpitations and night sweats. Initial troponin negative but EKG showed ST depression in the inferior leads. Given aspirin and admitted to a telemetry bed for observation.  Assessment/Plan:   Principal Problem:   Chest pain Resolved spontaneously.   D-dimer and troponin negative 4. 2D echocardiogram EF 55-60% with grade 1 diastolic dysfunction without regional wall motion mellitus. Continue aspirin and telemetry monitoring. 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 Has never been on [facility]. No evidence of arrhythmias on telemetry. K 3.7, Mg 2.2 - Monitor electrolytes and replete as needed to maintain K >4, Mg >2 - Continue diltiazem - Could consider an event monitor at discharge       Hypothyroidism due to Hashimoto's thyroiditis TSH low. Free T4 mildly elevated 1.26. Decrease Synthroid to 112.5 g daily. The patient states that she became symptomatic with no energy when her dose was 100 g daily.    Anxiety May the exacerbated by over replacement of thyroid hormone. Synthroid reduced. Continue Xanax as needed.    Immunodeficiency ([place])  Lungs are hyperinflated with chronic bronchitic changes on chest x-ray Patient reported h/o chronic scarring in her lungs from repeated bouts of pneumonia. She does not have any evidence of pneumonia  clinically. CT scan of the chest not currently indicated.    Fatigue PT evaluation requested.  Body mass index is 18.05 kg/m.   Family Communication/Anticipated D/C date and plan/Code Status   DVT prophylaxis: Lovenox ordered. Code Status: Full Code.  Family Communication: No family currently at the bedside. Disposition Plan: Hopefully home in the next 24 hours if cleared by cardiology.   Medical Consultants:    Cardiology   Anti-Infectives:    None  Subjective:   No chest pain, no shortness of breath but does feel short winded when she gets up  Objective:    Vitals:   10/31/17 0933 10/31/17 0935 10/31/17 0937 10/31/17 1132  BP: (!) 141/89 (!) 153/73 (!) 149/80 (!) 138/59  Pulse: (!) 104 (!) 107 (!) 104 79  Resp:    20  Temp:    97.6 F (36.4 C)  TempSrc:    Oral  SpO2:    99%  Weight:      Height:        Intake/Output Summary (Last 24 hours) at 10/31/2017 1232 Last data filed at 10/31/2017 1123 Gross per 24 hour  Intake 358 ml  Output 500 ml  Net -142 ml   Filed Weights   10/29/17 1804 10/29/17 2332 10/31/17 0439  Weight: 54.4 kg (120 lb) 54.3 kg (119 lb 11.2 oz) 53.1 kg (117 lb)    Exam: General: No acute distress. Cardiovascular: Heart sounds show a regular rate, and rhythm. No gallops or rubs. No murmurs. No JVD. Lungs: Clear to auscultation bilaterally with good air movement. No rales, rhonchi or wheezes. Abdomen: Soft, nontender, nondistended with normal active bowel sounds. No  masses. No hepatosplenomegaly. Neurological: Alert and oriented 3. Moves all extremities 4 with equal strength. Cranial nerves II through XII grossly intact. Skin: Warm and dry. No rashes or lesions. Extremities: No clubbing or cyanosis. No edema. Pedal pulses 2+. Psychiatric: Mood and affect are normal. Insight and judgment are normal.   Data Reviewed:   I have personally reviewed following labs and imaging studies:  Labs: Labs show the following:   Basic  Metabolic Panel: Recent Labs  Lab 10/29/17 1901 10/30/17 0113 10/30/17 0318  NA 140  --  140  K 3.8  --  3.7  CL 105  --  109  CO2 27  --  25  GLUCOSE 93  --  92  BUN 17  --  12  CREATININE 0.82  --  0.69  CALCIUM 8.5*  --  7.7*  MG  --  2.2  --    GFR Estimated Creatinine Clearance: 53.3 mL/min (by C-G formula based on SCr of 0.69 mg/dL).  CBC: Recent Labs  Lab 10/29/17 1901 10/30/17 0318  WBC 6.8 5.5  NEUTROABS  --  2.9  HGB 14.3 12.0  HCT 42.7 35.5*  MCV 89.7 89.9  PLT 337 273   Cardiac Enzymes: Recent Labs  Lab 10/29/17 1901 10/30/17 0113 10/30/17 0318 10/30/17 0651  TROPONINI <0.03 <0.03 <0.03 <0.03   D-Dimer: Recent Labs    10/30/17 0113  DDIMER 0.34   Lipid Profile: Recent Labs    10/30/17 0318  CHOL 146  HDL 42  LDLCALC 91  TRIG 64  CHOLHDL 3.5   Thyroid function studies: Recent Labs    10/29/17 16-Sep-2001  TSH 0.126*    Microbiology No results found for this or any previous visit (from the past 240 hour(s)).  Procedures and diagnostic studies:  Dg Chest 2 View  Result Date: 10/29/2017 CLINICAL DATA:  Atrial flutter with weak spells and shortness of breath. EXAM: CHEST - 2 VIEW COMPARISON:  06/26/2017 and 06/19/2016 FINDINGS: The cardiac silhouette, mediastinal and hilar contours are within normal limits and stable. The lungs demonstrate hyperinflation and there are chronic appearing bronchitic changes with increased interstitial markings and suspected vague nodularity. The patient at a prior neck CT which showed the lung apices. There was clearly a pattern of centrilobular nodules and probable tree-in-bud appearance suggesting chronic inflammation or possible atypical infection such as MAC. A full chest CT may be helpful for further evaluation. IMPRESSION: Chronic appearing bronchitic type changes with possible chronic inflammation or atypical infection based on the prior CT scan. Recommend chest CT for further evaluation. Electronically Signed    By: Marijo Sanes M.D.   On: 10/29/2017 19:18    Medications:   . diltiazem  180 mg Oral QHS  . enoxaparin (LOVENOX) injection  40 mg Subcutaneous Daily  . levothyroxine  112 mcg Oral QAC breakfast  . tiZANidine  4 mg Oral QHS   Continuous Infusions:   LOS: 0 days   Benito Mccreedy  [facility] Pager [phone]  *Please refer to [email or web address], password [email or web address] to get updated schedule on who will round on this patient, as hospitalists switch teams weekly. If 7PM-7AM, please contact night-coverage at [email or web address], password [email or web address] for any overnight needs.  10/31/2017, 12:32 PM

## 2017-10-31 NOTE — Plan of Care (Signed)
  Problem: Activity: Goal: Risk for activity intolerance will decrease Outcome: Completed/Met   Problem: Nutrition: Goal: Adequate nutrition will be maintained Outcome: Completed/Met   Problem: Coping: Goal: Level of anxiety will decrease Outcome: Completed/Met   Problem: Elimination: Goal: Will not experience complications related to bowel motility Outcome: Completed/Met   Problem: Activity: Goal: Risk for activity intolerance will decrease Outcome: Completed/Met   Problem: Nutrition: Goal: Adequate nutrition will be maintained Outcome: Completed/Met   Problem: Coping: Goal: Level of anxiety will decrease Outcome: Completed/Met   Problem: Elimination: Goal: Will not experience complications related to bowel motility Outcome: Completed/Met

## 2017-10-31 NOTE — Progress Notes (Signed)
Patient resting comfortably during shift report. Denies complaints.  

## 2017-11-30 ENCOUNTER — Ambulatory Visit: Payer: Medicare Other | Admitting: Cardiology

## 2018-02-17 ENCOUNTER — Ambulatory Visit: Payer: Medicare Other | Admitting: Cardiology

## 2018-03-02 NOTE — Progress Notes (Deleted)
HPI: Follow-up chest pain and supraventricular tachycardia.  Previously followed in [facility].  Question history of atrial flutter versus ectopic atrial tachycardia versus AVNRT. Refused ablation in past. Also apparently with negative stress test previously.  Admitted April 2019 with chest pain and ruled out.  Echocardiogram April 2019 showed normal LV function, mild diastolic dysfunction and mild aortic insufficiency.  Nuclear study April 2019 showed ejection fraction 67%, breast attenuation but no ischemia.  Chest x-ray April 2019 showed chronic changes with possible chronic inflammation or atypical infection and chest CT recommended. Since last seen,   Current Outpatient Medications  Medication Sig Dispense Refill  . ALPRAZolam (XANAX) 1 MG tablet Take 1 mg by mouth at bedtime.     . B Complex-C-Folic Acid (FOLBEE PLUS) TABS Take 1 tablet by mouth daily with lunch.     . Calcium Carb-Cholecalciferol (CALCIUM 600-D PO) Take 1,200 mg by mouth daily with lunch.    . Dietary Management Product (VASCULERA) TABS Take 630 mg by mouth at bedtime. For spasms/cramps in feet and legs  3  . diltiazem (CARDIZEM CD) 180 MG 24 hr capsule Take 180 mg by mouth at bedtime.    Marland Kitchen estradiol (ESTRACE) 0.5 MG tablet Take 0.5 mg by mouth daily with lunch.     . levothyroxine (SYNTHROID, LEVOTHROID) 112 MCG tablet Take 1 tablet (112 mcg total) by mouth daily before breakfast. 30 tablet 0  . magnesium oxide (MAG-OX) 400 MG tablet Take 400 mg by mouth at bedtime.    . Multiple Vitamins-Minerals (ZINC PO) Take 400 mg by mouth daily with lunch.    Marland Kitchen tiZANidine (ZANAFLEX) 4 MG tablet Take 4 mg by mouth at bedtime.      No current facility-administered medications for this visit.      Past Medical History:  Diagnosis Date  . A-fib ([place])   . Arthritis   . Blood transfusion without reported diagnosis   . COPD (chronic obstructive pulmonary disease) ([place])   . Hashimoto's disease   . Immune deficiency disorder  ([place])   . Pneumonia   . TIA (transient ischemic attack)     Past Surgical History:  Procedure Laterality Date  . ABDOMINAL HYSTERECTOMY    . APPENDECTOMY    . CATARACT EXTRACTION Bilateral   . CHOLECYSTECTOMY    . TONSILLECTOMY      Social History   Socioeconomic History  . Marital status: Widowed    Spouse name: Not on file  . Number of children: Not on file  . Years of education: Not on file  . Highest education level: Not on file  Occupational History  . Not on file  Social Needs  . Financial resource strain: Not on file  . Food insecurity:    Worry: Not on file    Inability: Not on file  . Transportation needs:    Medical: Not on file    Non-medical: Not on file  Tobacco Use  . Smoking status: Former Research scientist (life sciences)  . Smokeless tobacco: Never Used  Substance and Sexual Activity  . Alcohol use: No  . Drug use: No  . Sexual activity: Not on file  Lifestyle  . Physical activity:    Days per week: Not on file    Minutes per session: Not on file  . Stress: Not on file  Relationships  . Social connections:    Talks on phone: Not on file    Gets together: Not on file    Attends religious service: Not  on file    Active member of club or organization: Not on file    Attends meetings of clubs or organizations: Not on file    Relationship status: Not on file  . Intimate partner violence:    Fear of current or ex partner: Not on file    Emotionally abused: Not on file    Physically abused: Not on file    Forced sexual activity: Not on file  Other Topics Concern  . Not on file  Social History Narrative  . Not on file    Family History  Problem Relation Age of Onset  . CAD Father 21       multiple MI's, s/p CABG; deceased in 38s    ROS: no fevers or chills, productive cough, hemoptysis, dysphasia, odynophagia, melena, hematochezia, dysuria, hematuria, rash, seizure activity, orthopnea, PND, pedal edema, claudication. Remaining systems are negative.  Physical  Exam: Well-developed well-nourished in no acute distress.  Skin is warm and dry.  HEENT is normal.  Neck is supple.  Chest is clear to auscultation with normal expansion.  Cardiovascular exam is regular rate and rhythm.  Abdominal exam nontender or distended. No masses palpated. Extremities show no edema. neuro grossly intact  ECG- personally reviewed  A/P  1 history of supraventricular tachycardia-patient declined ablation per previous office notes at [facility].  No recent episodes.  2 recent chest pain-patient ruled out and nuclear study showed no ischemia.  We will not pursue further cardiac evaluation.  3 dyspnea-LV function is normal.  Previous chest x-ray as outlined in HPI.  Would likely benefit from pulmonary evaluation and we will arrange.  May need CT scan.  Kirk Ruths, MD

## 2018-03-03 ENCOUNTER — Ambulatory Visit: Payer: Medicare Other | Admitting: Cardiology

## 2018-05-12 NOTE — Progress Notes (Signed)
HPI: FU SVT and CP.  Recently admitted to [facility] with chest pain and ruled out.  Echocardiogram April 2019 showed normal LV function and mild diastolic dysfunction.  There are study April 2019 showed ejection fraction 67%.  Normal perfusion.  Also with history of supraventricular tachycardia/ectopic atrial tachycardia treated with Cardizem.  Since last seen she was recently treated for pneumonia.  Her dyspnea has improved.  She denies chest pain.  She has palpitations predominantly at night that she attributes to anxiety.  Current Outpatient Medications  Medication Sig Dispense Refill  . ALPRAZolam (XANAX) 1 MG tablet Take 1 mg by mouth at bedtime.     . B Complex-C-Folic Acid (FOLBEE PLUS) TABS Take 1 tablet by mouth daily with lunch.     . Calcium Carb-Cholecalciferol (CALCIUM 600-D PO) Take 1,200 mg by mouth daily with lunch.    . Dietary Management Product (VASCULERA) TABS Take 630 mg by mouth at bedtime. For spasms/cramps in feet and legs  3  . diltiazem (CARDIZEM CD) 180 MG 24 hr capsule Take 180 mg by mouth at bedtime.    Marland Kitchen estradiol (ESTRACE) 0.5 MG tablet Take 0.5 mg by mouth daily with lunch.     . levothyroxine (SYNTHROID, LEVOTHROID) 112 MCG tablet Take 1 tablet (112 mcg total) by mouth daily before breakfast. 30 tablet 0  . magnesium oxide (MAG-OX) 400 MG tablet Take 400 mg by mouth at bedtime.    . Multiple Vitamins-Minerals (ZINC PO) Take 400 mg by mouth daily with lunch.    Marland Kitchen tiZANidine (ZANAFLEX) 4 MG tablet Take 4 mg by mouth at bedtime.      No current facility-administered medications for this visit.      Past Medical History:  Diagnosis Date  . Arthritis   . Blood transfusion without reported diagnosis   . COPD (chronic obstructive pulmonary disease) ([place])   . Hashimoto's disease   . Immune deficiency disorder ([place])   . Pneumonia   . SVT (supraventricular tachycardia) ([place])   . TIA (transient ischemic attack)     Past Surgical History:    Procedure Laterality Date  . ABDOMINAL HYSTERECTOMY    . APPENDECTOMY    . CATARACT EXTRACTION Bilateral   . CHOLECYSTECTOMY    . TONSILLECTOMY      Social History   Socioeconomic History  . Marital status: Widowed    Spouse name: Not on file  . Number of children: Not on file  . Years of education: Not on file  . Highest education level: Not on file  Occupational History  . Not on file  Social Needs  . Financial resource strain: Not on file  . Food insecurity:    Worry: Not on file    Inability: Not on file  . Transportation needs:    Medical: Not on file    Non-medical: Not on file  Tobacco Use  . Smoking status: Former Research scientist (life sciences)  . Smokeless tobacco: Never Used  Substance and Sexual Activity  . Alcohol use: No  . Drug use: No  . Sexual activity: Not on file  Lifestyle  . Physical activity:    Days per week: Not on file    Minutes per session: Not on file  . Stress: Not on file  Relationships  . Social connections:    Talks on phone: Not on file    Gets together: Not on file    Attends religious service: Not on file    Active member of  club or organization: Not on file    Attends meetings of clubs or organizations: Not on file    Relationship status: Not on file  . Intimate partner violence:    Fear of current or ex partner: Not on file    Emotionally abused: Not on file    Physically abused: Not on file    Forced sexual activity: Not on file  Other Topics Concern  . Not on file  Social History Narrative  . Not on file    Family History  Problem Relation Age of Onset  . CAD Father 58       multiple MI's, s/p CABG; deceased in 82s    ROS: no fevers or chills, productive cough, hemoptysis, dysphasia, odynophagia, melena, hematochezia, dysuria, hematuria, rash, seizure activity, orthopnea, PND, pedal edema, claudication. Remaining systems are negative.  Physical Exam: Well-developed well-nourished in no acute distress.  Skin is warm and dry.  HEENT is  normal.  Neck is supple.  Chest is clear to auscultation with normal expansion.  Cardiovascular exam is regular rate and rhythm.  Abdominal exam nontender or distended. No masses palpated. Extremities show no edema. neuro grossly intact  ECG-sinus rhythm at a rate of 79, nonspecific ST changes.  No change compared to October 29, 2017.  Personally reviewed  A/P  1 supraventricular tachycardia-continue present dose of Cardizem.  She has predominantly p.m. palpitations that she attributes to anxiety.  If they worsen we will consider an event monitor in the future.  2 history of chest pain-no recurrent symptoms.  Previous nuclear study negative.  No plans for further ischemia evaluation.  3 COPD- per primary care.  Kirk Ruths, MD

## 2018-05-19 ENCOUNTER — Encounter: Payer: Self-pay | Admitting: Cardiology

## 2018-05-19 ENCOUNTER — Ambulatory Visit (INDEPENDENT_AMBULATORY_CARE_PROVIDER_SITE_OTHER): Payer: Medicare Other | Admitting: Cardiology

## 2018-05-19 VITALS — BP 126/70 | HR 79 | Ht 67.5 in | Wt 116.4 lb

## 2018-05-19 DIAGNOSIS — I471 Supraventricular tachycardia: Secondary | ICD-10-CM

## 2018-05-19 DIAGNOSIS — Z8679 Personal history of other diseases of the circulatory system: Secondary | ICD-10-CM | POA: Diagnosis not present

## 2018-05-19 DIAGNOSIS — R079 Chest pain, unspecified: Secondary | ICD-10-CM

## 2018-05-19 NOTE — Patient Instructions (Signed)
Medication Instructions:  Your physician recommends that you continue on your current medications as directed. Please refer to the Current Medication list given to you today.  If you need a refill on your cardiac medications before your next appointment, please call your pharmacy.   Lab work: None ordered  Testing/Procedures: None ordered  Follow-Up: At [facility], you and your health needs are our priority.  As part of our continuing mission to provide you with exceptional heart care, we have created designated Provider Care Teams.  These Care Teams include your primary Cardiologist (physician) and Advanced Practice Providers (APPs -  Physician Assistants and Nurse Practitioners) who all work together to provide you with the care you need, when you need it. Your physician recommends that you schedule a follow-up appointment in: 6 months with Dr.Crenshaw   Any Other Special Instructions Will Be Listed Below (If Applicable).

## 2018-05-20 ENCOUNTER — Other Ambulatory Visit: Payer: Self-pay | Admitting: Cardiology

## 2018-06-21 ENCOUNTER — Telehealth: Payer: Self-pay | Admitting: Cardiology

## 2018-06-21 MED ORDER — DILTIAZEM HCL ER COATED BEADS 180 MG PO CP24: ORAL_CAPSULE | ORAL | 12 refills | Status: DC

## 2018-06-21 NOTE — Telephone Encounter (Signed)
Refill sent to the pharmacy electronically.  

## 2018-06-21 NOTE — Telephone Encounter (Signed)
Patient is out of medication   1. Which medications need to be refilled? (please list name of each medication and dose if known) diltiazem 180mg  24 hour tablet  2. Which pharmacy/location (including street and city if local pharmacy) is medication to be sent to? [facility]  3. Do they need a 30 day or 90 day supply? [place]

## 2018-07-19 NOTE — Progress Notes (Signed)
Cardiology Office Note:    Date:  07/20/2018   ID:  Kimberly Phillips, DOB 1946-01-02, MRN [id number]  PCP:  Maylon Peppers, MD  Cardiologist:  Shirlee More, MD    Referring MD: Maylon Peppers, MD    ASSESSMENT:    1. Hypothyroidism due to Hashimoto's thyroiditis   2. Palpitation   3. Junctional tachycardia (HCC)    PLAN:    In order of problems listed above:  1. Recent labs suggest she is hypothyroid check full panel may be related to her palpitation and weight loss 2. Low suggestive of PVCs 14-day event monitor continue her current beta-blocker 3. Uncertain clinical significance she was asymptomatic when the event happened in the hospital I do not think it is a clinical problem.   Next appointment: 6 weeks   Medication Adjustments/Labs and Tests Ordered: Current medicines are reviewed at length with the patient today.  Concerns regarding medicines are outlined above.  No orders of the defined types were placed in this encounter.  No orders of the defined types were placed in this encounter.   Chief Complaint  Patient presents with  . Palpitations    History of Present Illness:    Kimberly Phillips is a 72 y.o. female with a hx of recent admission to  to [facility] with chest pain October 2019 and ruled out. Echocardiogram April 2019 showed normal LV function and mild diastolic dysfunction. There are study April 2019 showed ejection fraction 67%. Normal perfusion. Also with history of supraventricular tachycardia/ectopic atrial tachycardia treated with Cardizem. She was last seen by Dr Stanford Breed 05/19/18 at discharge.  Prior to this visit I reviewed her hospital records including echocardiogram myocardial perfusion study and progress notes EKGs rhythm strips.  Her echocardiogram was normal she did not have atrial enlargement  MPI 10/31/17:  There was no ST segment deviation noted during stress.  No T wave inversion was noted during stress.  Defect 1: There is a  medium defect of mild severity present in the mid anterior location.  The study is normal.  This is a low risk study.   Low risk stress nuclear study with normal perfusion and normal left ventricular regional and global systolic function. There is a prominent reduction in perfusion in the apical anterior wall, with sparing of the true apex and septum, nonreversible and associated with normal wall motion. This is consistent with breast attenuation artifact.  Compliance with diet, lifestyle and medications: Yes  She has had a gap in primary care.  She is having frequent episodes several times a week almost daily awakens her from her sleep particularly at night were heart problems forcefully she gets a discomfort in her throat and is concerned regarding arrhythmia.  In hospital she was noted to have a brief junctional tachycardia is not having any episodes of sustained rapid heart rhythm overall she feels very poorly week tremulous she is transitioning off the Xanax which may be part of the issue but also her labs in hospital showed a low TSH implying she was hypothyroid.  She is unsure the last time that her thyroid was completely checked and we will go ahead and check a TSH free T3 and T4.  Evaluate arrhythmia 14-day ZIO monitor.  I will assess back in the office afterwards.  Her EKG showed biatrial enlargement she had echocardiogram done in hospital. Past Medical History:  Diagnosis Date  . Arthritis   . Blood transfusion without reported diagnosis   . COPD (chronic obstructive pulmonary  disease) ([place])   . Hashimoto's disease   . Immune deficiency disorder ([place])   . Pneumonia   . SVT (supraventricular tachycardia) ([place])   . TIA (transient ischemic attack)     Past Surgical History:  Procedure Laterality Date  . ABDOMINAL HYSTERECTOMY    . APPENDECTOMY    . CATARACT EXTRACTION Bilateral   . CHOLECYSTECTOMY    . TONSILLECTOMY      Current Medications: Current Meds  Medication Sig    . ALPRAZolam (XANAX) 0.5 MG tablet Take 1 mg by mouth at bedtime.   . B Complex-C-Folic Acid (FOLBEE PLUS) TABS Take 1 tablet by mouth daily with lunch.   . Calcium Carb-Cholecalciferol (CALCIUM 600-D PO) Take 1,200 mg by mouth daily with lunch.  . Dietary Management Product (VASCULERA) TABS Take 630 mg by mouth at bedtime. For spasms/cramps in feet and legs  . diltiazem (CARDIZEM CD) 180 MG 24 hr capsule TAKE 1 CAPSULE BY MOUTH EVERY DAY  . estradiol (ESTRACE) 0.5 MG tablet Take 0.5 mg by mouth daily with lunch.   . levothyroxine (SYNTHROID, LEVOTHROID) 112 MCG tablet Take 1 tablet (112 mcg total) by mouth daily before breakfast.  . magnesium oxide (MAG-OX) 400 MG tablet Take 400 mg by mouth at bedtime.  . predniSONE (DELTASONE) 5 MG tablet Take 5 mg by mouth daily.  . propranolol (INDERAL) 10 MG tablet Take 10 mg by mouth daily.  Marland Kitchen tiZANidine (ZANAFLEX) 4 MG tablet Take 4 mg by mouth at bedtime.   . traZODone (DESYREL) 50 MG tablet Take 50 mg by mouth at bedtime.     Allergies:   Codeine; Gamma globulin [immune globulin]; Levofloxacin; Moxifloxacin; Piperacillin-tazobactam in dex; Ciprofloxacin; Liothyronine; Macrolides and ketolides; Tape; Telbivudine; Troleandomycin; Amoxicillin-pot clavulanate; Atenolol; Nitrofurantoin; and Sulfa antibiotics   Social History   Socioeconomic History  . Marital status: Widowed    Spouse name: Not on file  . Number of children: Not on file  . Years of education: Not on file  . Highest education level: Not on file  Occupational History  . Not on file  Social Needs  . Financial resource strain: Not on file  . Food insecurity:    Worry: Not on file    Inability: Not on file  . Transportation needs:    Medical: Not on file    Non-medical: Not on file  Tobacco Use  . Smoking status: Former Research scientist (life sciences)  . Smokeless tobacco: Never Used  Substance and Sexual Activity  . Alcohol use: No  . Drug use: No  . Sexual activity: Not on file  Lifestyle  .  Physical activity:    Days per week: Not on file    Minutes per session: Not on file  . Stress: Not on file  Relationships  . Social connections:    Talks on phone: Not on file    Gets together: Not on file    Attends religious service: Not on file    Active member of club or organization: Not on file    Attends meetings of clubs or organizations: Not on file    Relationship status: Not on file  Other Topics Concern  . Not on file  Social History Narrative  . Not on file     Family History: The patient's family history includes CAD (age of onset: 54) in her father; Cancer in her mother; Diabetes in her maternal grandmother; Heart attack in her father. ROS:   Please see the history of present illness.    All  other systems reviewed and are negative.  EKGs/Labs/Other Studies Reviewed:    The following studies were reviewed today:  EKG:  EKG ordered today.  The ekg ordered today demonstrates sinus rhythm biatrial enlargement  Recent Labs: 10/29/2017: TSH 0.126 10/30/2017: BUN 12; Creatinine, Ser 0.69; Hemoglobin 12.0; Magnesium 2.2; Platelets 273; Potassium 3.7; Sodium 140  Recent Lipid Panel    Component Value Date/Time   CHOL 146 10/30/2017 0318   TRIG 64 10/30/2017 0318   HDL 42 10/30/2017 0318   CHOLHDL 3.5 10/30/2017 0318   VLDL 13 10/30/2017 0318   LDLCALC 91 10/30/2017 0318    Physical Exam:    VS:  BP 126/70 (BP Location: Right Arm, Patient Position: Sitting, Cuff Size: Normal)   Pulse 64   Ht 5' 7.5" (1.715 m)   Wt 113 lb 1.9 oz (51.3 kg)   SpO2 98%   BMI 17.46 kg/m     Wt Readings from Last 3 Encounters:  07/20/18 113 lb 1.9 oz (51.3 kg)  05/19/18 116 lb 6.4 oz (52.8 kg)  10/31/17 117 lb (53.1 kg)     GEN: thin looks hyperthyroid Well nourished, well developed in no acute distress HEENT: Normal NECK: No JVD; No carotid bruits LYMPHATICS: No lymphadenopathy CARDIAC: RRR, no murmurs, rubs, gallops RESPIRATORY:  Clear to auscultation without rales,  wheezing or rhonchi  ABDOMEN: Soft, non-tender, non-distended MUSCULOSKELETAL:  No edema; No deformity  SKIN: Warm and dry NEUROLOGIC:  Alert and oriented x 3 PSYCHIATRIC:  Normal affect    Signed, Shirlee More, MD  07/20/2018 11:33 AM    [place]

## 2018-07-20 ENCOUNTER — Encounter: Payer: Self-pay | Admitting: Cardiology

## 2018-07-20 ENCOUNTER — Ambulatory Visit (INDEPENDENT_AMBULATORY_CARE_PROVIDER_SITE_OTHER): Payer: Medicare Other | Admitting: Cardiology

## 2018-07-20 VITALS — BP 126/70 | HR 64 | Ht 67.5 in | Wt 113.1 lb

## 2018-07-20 DIAGNOSIS — R002 Palpitations: Secondary | ICD-10-CM

## 2018-07-20 DIAGNOSIS — E038 Other specified hypothyroidism: Secondary | ICD-10-CM | POA: Diagnosis not present

## 2018-07-20 DIAGNOSIS — E063 Autoimmune thyroiditis: Secondary | ICD-10-CM | POA: Diagnosis not present

## 2018-07-20 DIAGNOSIS — I471 Supraventricular tachycardia: Secondary | ICD-10-CM

## 2018-07-20 DIAGNOSIS — I4719 Other supraventricular tachycardia: Secondary | ICD-10-CM

## 2018-07-20 HISTORY — DX: Other supraventricular tachycardia: I47.19

## 2018-07-20 HISTORY — DX: Supraventricular tachycardia: I47.1

## 2018-07-20 HISTORY — DX: Palpitations: R00.2

## 2018-07-20 NOTE — Patient Instructions (Signed)
Medication Instructions:  Your physician recommends that you continue on your current medications as directed. Please refer to the Current Medication list given to you today.  If you need a refill on your cardiac medications before your next appointment, please call your pharmacy.   Lab work: Your physician recommends that you return for lab work today: TSH, T4 free, T3 free.  If you have labs (blood work) drawn today and your tests are completely normal, you will receive your results only by: Marland Kitchen MyChart Message (if you have MyChart) OR . A paper copy in the mail If you have any lab test that is abnormal or we need to change your treatment, we will call you to review the results.  Testing/Procedures: You had an EKG today.   Your physician has recommended that you wear a ZIO monitor. ZIO monitors are medical devices that record the heart's electrical activity. Doctors most often use these monitors to diagnose arrhythmias. Arrhythmias are problems with the speed or rhythm of the heartbeat. The monitor is a small, portable device. You can wear one while you do your normal daily activities. This is usually used to diagnose what is causing palpitations/syncope (passing out). Wear for 14 days.   Follow-Up: At [facility], you and your health needs are our priority.  As part of our continuing mission to provide you with exceptional heart care, we have created designated Provider Care Teams.  These Care Teams include your primary Cardiologist (physician) and Advanced Practice Providers (APPs -  Physician Assistants and Nurse Practitioners) who all work together to provide you with the care you need, when you need it. You will need a follow up appointment in 4 weeks.     **Please call [facility] to schedule an appointment with Ed Saguier, PA at [phone]!

## 2018-07-21 LAB — TSH+T4F+T3FREE
Free T4: 1.97 ng/dL — ABNORMAL HIGH (ref 0.82–1.77)
T3, Free: 2.8 pg/mL (ref 2.0–4.4)
TSH: 0.148 u[IU]/mL — ABNORMAL LOW (ref 0.450–4.500)

## 2018-07-28 ENCOUNTER — Ambulatory Visit (INDEPENDENT_AMBULATORY_CARE_PROVIDER_SITE_OTHER): Payer: Medicare Other | Admitting: Medical

## 2018-07-28 ENCOUNTER — Encounter: Payer: Self-pay | Admitting: Medical

## 2018-07-28 ENCOUNTER — Telehealth: Payer: Self-pay | Admitting: Medical

## 2018-07-28 VITALS — BP 131/57 | HR 71 | Temp 97.8°F | Resp 16 | Ht 67.5 in | Wt 113.0 lb

## 2018-07-28 DIAGNOSIS — E039 Hypothyroidism, unspecified: Secondary | ICD-10-CM

## 2018-07-28 DIAGNOSIS — Z8701 Personal history of pneumonia (recurrent): Secondary | ICD-10-CM | POA: Diagnosis not present

## 2018-07-28 DIAGNOSIS — R000 Tachycardia, unspecified: Secondary | ICD-10-CM | POA: Diagnosis not present

## 2018-07-28 DIAGNOSIS — G4700 Insomnia, unspecified: Secondary | ICD-10-CM

## 2018-07-28 DIAGNOSIS — R251 Tremor, unspecified: Secondary | ICD-10-CM

## 2018-07-28 DIAGNOSIS — R7989 Other specified abnormal findings of blood chemistry: Secondary | ICD-10-CM | POA: Diagnosis not present

## 2018-07-28 LAB — T3, FREE: T3, Free: 2.8 pg/mL (ref 2.3–4.2)

## 2018-07-28 LAB — TSH: TSH: 0.48 u[IU]/mL (ref 0.35–4.50)

## 2018-07-28 LAB — T4, FREE: Free T4: 1.2 ng/dL (ref 0.60–1.60)

## 2018-07-28 MED ORDER — LEVOTHYROXINE SODIUM 75 MCG PO TABS: 75.0000 ug | ORAL_TABLET | Freq: Every day | ORAL | 3 refills | Status: DC

## 2018-07-28 NOTE — Progress Notes (Signed)
Subjective:    Patient ID: Kimberly Phillips, female    DOB: Dec 09, 1945, 73 y.o.   MRN: [id number]  HPI  Pt in for first time.  She is Occupational psychologist. Husband passed away 5 years ago.   Pt states she had episodes of pleuritic pain in past and gets pneumonia easily. Pt states has seen immune specialist who gave injection to prevent. Pt does not know what name of disorder is.   Pt has history of low thyroid in past. Pt may have oversupplamented.. Pt was told to take 1 tab every other day. Started this Monday every other day regimen. Dr. Bettina Gavia gave this advise. Pt tsh was low and t4 was high.   Pt has history of hx of tachycardia. Worst at night. In past pt states xanax would help slow her heart down. Pt is going to get holter next week.  Pt also has tremor rt hand that started 6 weeks ago. On propanolol.  Currently has rt lower lobe faint tenderness. Pt on ceftin and prednisone. Pulmonary doctor is treating her.  Pt takes occasional hyrdocodone for ha.(take periodically for 15 years)   Review of Systems  Constitutional: Negative for chills, fatigue and fever.  Respiratory: Positive for cough. Negative for chest tightness, shortness of breath and wheezing.        Rare occasional.  Cardiovascular: Positive for palpitations. Negative for chest pain.  Gastrointestinal: Negative for abdominal pain.  Musculoskeletal: Negative for back pain and neck pain.  Skin: Negative for rash.  Neurological: Negative for dizziness and headaches.       Hx of rt hand tremor.  Hematological: Negative for adenopathy. Does not bruise/bleed easily.  Psychiatric/Behavioral: Negative for agitation and confusion.    Past Medical History:  Diagnosis Date  . Arthritis   . Blood transfusion without reported diagnosis   . COPD (chronic obstructive pulmonary disease) ([place])   . Hashimoto's disease   . Immune deficiency disorder ([place])   . Pneumonia   . SVT (supraventricular tachycardia) ([place])   . TIA (transient  ischemic attack)      Social History   Socioeconomic History  . Marital status: Widowed    Spouse name: Not on file  . Number of children: Not on file  . Years of education: Not on file  . Highest education level: Not on file  Occupational History  . Not on file  Social Needs  . Financial resource strain: Not on file  . Food insecurity:    Worry: Not on file    Inability: Not on file  . Transportation needs:    Medical: Not on file    Non-medical: Not on file  Tobacco Use  . Smoking status: Former Research scientist (life sciences)  . Smokeless tobacco: Never Used  Substance and Sexual Activity  . Alcohol use: No  . Drug use: No  . Sexual activity: Not on file  Lifestyle  . Physical activity:    Days per week: Not on file    Minutes per session: Not on file  . Stress: Not on file  Relationships  . Social connections:    Talks on phone: Not on file    Gets together: Not on file    Attends religious service: Not on file    Active member of club or organization: Not on file    Attends meetings of clubs or organizations: Not on file    Relationship status: Not on file  . Intimate partner violence:    Fear of current or ex  partner: Not on file    Emotionally abused: Not on file    Physically abused: Not on file    Forced sexual activity: Not on file  Other Topics Concern  . Not on file  Social History Narrative  . Not on file    Past Surgical History:  Procedure Laterality Date  . ABDOMINAL HYSTERECTOMY    . APPENDECTOMY    . CATARACT EXTRACTION Bilateral   . CHOLECYSTECTOMY    . TONSILLECTOMY      Family History  Problem Relation Age of Onset  . CAD Father 14       multiple MI's, s/p CABG; deceased in 56s  . Heart attack Father   . Cancer Mother   . Diabetes Maternal Grandmother     Allergies  Allergen Reactions  . Codeine Itching  . Gamma Globulin [Immune Globulin] Nausea Only  . Levofloxacin Nausea And Vomiting  . Moxifloxacin Other (See Comments)    dizziness  .  Piperacillin-Tazobactam In Dex Other (See Comments)     Red Man Syndrome (ALLERGY) Within minutes of getting Zosyn, pt flushed, red all over and buring sensation.   . Ciprofloxacin Other (See Comments)    hallucinations   . Liothyronine Other (See Comments)    dizziness  . Macrolides And Ketolides Other (See Comments)    Kidney problems  . Tape Other (See Comments)    Skin tears  . Telbivudine Other (See Comments)    Caused Kidney problems  . Troleandomycin Other (See Comments)    Kidney problems  . Amoxicillin-Pot Clavulanate Diarrhea    Has patient had a PCN reaction causing immediate rash, facial/tongue/throat swelling, SOB or lightheadedness with hypotension: No Has patient had a PCN reaction causing severe rash involving mucus membranes or skin necrosis: No Has patient had a PCN reaction that required hospitalization: Unknown Has patient had a PCN reaction occurring within the last 10 years: Unknown If all of the above answers are "NO", then may proceed with Cephalosporin use.  . Atenolol Nausea Only  . Nitrofurantoin Nausea Only  . Sulfa Antibiotics Rash    Current Outpatient Medications on File Prior to Visit  Medication Sig Dispense Refill  . ALPRAZolam (XANAX) 0.5 MG tablet Take 1 mg by mouth at bedtime.     . B Complex-C-Folic Acid (FOLBEE PLUS) TABS Take 1 tablet by mouth daily with lunch.     . Calcium Carb-Cholecalciferol (CALCIUM 600-D PO) Take 1,200 mg by mouth daily with lunch.    . cefUROXime (CEFTIN) 500 MG tablet Take by mouth.    . Dietary Management Product (VASCULERA) TABS Take 630 mg by mouth at bedtime. For spasms/cramps in feet and legs  3  . diltiazem (CARDIZEM CD) 180 MG 24 hr capsule TAKE 1 CAPSULE BY MOUTH EVERY DAY 30 capsule 12  . estradiol (ESTRACE) 0.5 MG tablet Take 0.5 mg by mouth daily with lunch.     . levothyroxine (SYNTHROID, LEVOTHROID) 112 MCG tablet Take 1 tablet (112 mcg total) by mouth daily before breakfast. 30 tablet 0  . magnesium  oxide (MAG-OX) 400 MG tablet Take 400 mg by mouth at bedtime.    . predniSONE (DELTASONE) 5 MG tablet Take 5 mg by mouth daily.    . propranolol (INDERAL) 10 MG tablet Take 10 mg by mouth daily.    Marland Kitchen tiZANidine (ZANAFLEX) 4 MG tablet Take 4 mg by mouth at bedtime.     . traZODone (DESYREL) 50 MG tablet Take 50 mg by mouth at bedtime.  No current facility-administered medications on file prior to visit.     BP (!) 131/57   Pulse 71   Temp 97.8 F (36.6 C) (Oral)   Resp 16   Ht 5' 7.5" (1.715 m)   Wt 113 lb (51.3 kg)   SpO2 99%   BMI 17.44 kg/m       Objective:   Physical Exam  General Mental Status- Alert. General Appearance- Not in acute distress.   Skin General: Color- Normal Color. Moisture- Normal Moisture.  Neck Carotid Arteries- Normal color. Moisture- Normal Moisture. No carotid bruits. No JVD.  Chest and Lung Exam Auscultation: Breath Sounds:-Normal.  Cardiovascular Auscultation:Rythm- Regular. Murmurs & Other Heart Sounds:Auscultation of the heart reveals- No Murmurs.  Abdomen Inspection:-Inspeection Normal. Palpation/Percussion:Note:No mass. Palpation and Percussion of the abdomen reveal- Non Tender, Non Distended + BS, no rebound or guarding.  Neurologic Cranial Nerve exam:- CN III-XII intact(No nystagmus), symmetric smile. Strength:- 5/5 equal and symmetric strength both upper and lower extremities.      Assessment & Plan:  For your history hypothyroidism but abnormal thyroid function studies recently, I will repeat TSH, T4 and T3 studies today.  Then make recommendation on dosage and/or possible referral to endocrinologist.  Tachycardia recently may be related to thyroid function.  Hopefully will get better understanding after Holter monitor study is done.  With your history of pneumonia, continue current medications given by pulmonologist.  When I get records will try to find the exact immune deficiency diagnosis you have.  For history of  tremors continue with propanolol.  For insomnia continue Xanax and trazodone presently.  On follow-up next visit will get UDS and get you to sign controlled medication contract for Xanax.  Follow-up in 2 weeks or as needed.  New pt with multiple medical problems. 50% of time spent counseling on plan going forward. Extra time spent on controlled medication policy. And explaining precaution on using xanax and narcotics.

## 2018-07-28 NOTE — Telephone Encounter (Signed)
Rx thyroid med sent to pt pharmacy.

## 2018-07-28 NOTE — Patient Instructions (Signed)
For your history hypothyroidism but abnormal thyroid function studies recently, I will repeat TSH, T4 and T3 studies today.  Then make recommendation on dosage and/or possible referral to endocrinologist.  Tachycardia recently may be related to thyroid function.  Hopefully will get better understanding after Holter monitor study is done.  With your history of pneumonia, continue current medications given by pulmonologist.  When I get records will try to find the exact immune deficiency diagnosis you have.  For history of tremors continue with propanolol.  For insomnia continue Xanax and trazodone presently.  On follow-up next visit will get UDS and get you to sign controlled medication contract for Xanax.  Follow-up in 2 weeks or as needed.

## 2018-08-02 ENCOUNTER — Telehealth: Payer: Self-pay

## 2018-08-02 ENCOUNTER — Telehealth: Payer: Self-pay | Admitting: Medical

## 2018-08-02 NOTE — Telephone Encounter (Signed)
Left message for pt. To call back for lab results.

## 2018-08-02 NOTE — Telephone Encounter (Signed)
Pt. Given lab results and instructions.Verbalizes understanding. 

## 2018-08-02 NOTE — Telephone Encounter (Signed)
Copied from [place] [id number]. Topic: General - Other >> Aug 02, 2018  9:56 AM Virl Axe D wrote: Reason for CRM: Pt called to receive lab results. NT was on a triage call. Please advise.

## 2018-08-05 ENCOUNTER — Ambulatory Visit (INDEPENDENT_AMBULATORY_CARE_PROVIDER_SITE_OTHER): Payer: Medicare Other

## 2018-08-05 DIAGNOSIS — R002 Palpitations: Secondary | ICD-10-CM | POA: Diagnosis not present

## 2018-08-09 ENCOUNTER — Other Ambulatory Visit: Payer: Self-pay | Admitting: Medical

## 2018-08-09 NOTE — Telephone Encounter (Signed)
Pt states she needs the hydrocodone for the cough she has and she also wants to let the dr know she has came off her xanax.

## 2018-08-10 ENCOUNTER — Telehealth: Payer: Self-pay | Admitting: Medical

## 2018-08-10 MED ORDER — HYDROCODONE-HOMATROPINE 5-1.5 MG/5ML PO SYRP: 5.0000 mL | ORAL_SOLUTION | Freq: Four times a day (QID) | ORAL | 0 refills | Status: DC | PRN

## 2018-08-10 NOTE — Telephone Encounter (Signed)
Copied from [place]. Topic: Quick Communication - See Telephone Encounter >> Aug 10, 2018  3:47 PM Rutherford Nail, NT wrote: CRM for notification. See Telephone encounter for: 08/10/18. [facility] calling and wanted to inform Mackie Pai that the patient is currently on tiZANidine (ZANAFLEX) 4 MG tablet, traZODone (DESYREL) 50 MG tablet, and norco. Is it still okay that she take the HYDROcodone-homatropine (HYCODAN) 5-1.5 MG/5ML syrup that was prescribed today? Please advise.

## 2018-08-10 NOTE — Telephone Encounter (Signed)
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 Pt told me no longer using xanax. Pharmacist is going to clarify not to use any hycodan tabs which she should not have any more of any way per narx report(also will re-emphasize to discontinue xanax)  I explained to pharmacist short course hycodan only for her recent cough. Regarding her HA I don't plan to continue hycodan tabs. On follow up will likely refer her to neurologist.

## 2018-08-10 NOTE — Telephone Encounter (Signed)
Medication not delegated to NT to fill

## 2018-08-10 NOTE — Telephone Encounter (Signed)
Please advise 

## 2018-08-10 NOTE — Telephone Encounter (Signed)
RX hycodan sent to pt pharmacy.

## 2018-08-10 NOTE — Telephone Encounter (Signed)
Pt new to office. She did have cough recently and also has long history of being on hydrocodone tablets for ha. We pulled narx report and looks like she has been getting hydrocodone consistently about 90 tabs a month over past year or more.(looks out of that)  I will send in hycodan syrup for cough. This has hydrocodone in it. Recommend appointment to discuss HA. Chronic use of hydrocodone for ha often not recommended. May need to refer her to neurologist.   With hycodan. Make sure not to use any tablets with hydrocodone. If she has residual tablets left.  Good to hear she decided to stop xanax.

## 2018-08-10 NOTE — Telephone Encounter (Signed)
Pt requesting refill on hydrocodone. Please advise

## 2018-08-10 NOTE — Telephone Encounter (Signed)
Copied from [place] [id number]. Topic: Quick Communication - Rx Refill/Question >> Aug 10, 2018  9:34 AM Celedonio Savage L wrote: Medication: HYDROcodone-acetaminophen (NORCO) 10-325 MG per tablet 1 tablet   Pt states that [facility] would fill this medicine for her because another provider use to fill this   Has the patient contacted their pharmacy? Yes.   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 We ask that you follow-up with your pharmacy.

## 2018-08-11 ENCOUNTER — Telehealth: Payer: Self-pay | Admitting: *Deleted

## 2018-08-11 ENCOUNTER — Encounter: Payer: Self-pay | Admitting: Medical

## 2018-08-11 ENCOUNTER — Telehealth: Payer: Self-pay | Admitting: Medical

## 2018-08-11 ENCOUNTER — Ambulatory Visit (INDEPENDENT_AMBULATORY_CARE_PROVIDER_SITE_OTHER): Payer: Medicare Other | Admitting: Medical

## 2018-08-11 VITALS — BP 129/53 | HR 0 | Temp 98.1°F | Resp 16 | Ht 61.5 in | Wt 111.4 lb

## 2018-08-11 DIAGNOSIS — R059 Cough, unspecified: Secondary | ICD-10-CM

## 2018-08-11 DIAGNOSIS — R109 Unspecified abdominal pain: Secondary | ICD-10-CM | POA: Diagnosis not present

## 2018-08-11 DIAGNOSIS — R05 Cough: Secondary | ICD-10-CM

## 2018-08-11 DIAGNOSIS — M542 Cervicalgia: Secondary | ICD-10-CM

## 2018-08-11 DIAGNOSIS — R51 Headache: Secondary | ICD-10-CM

## 2018-08-11 DIAGNOSIS — R519 Headache, unspecified: Secondary | ICD-10-CM

## 2018-08-11 LAB — CBC WITH DIFFERENTIAL/PLATELET
BASOS PCT: 0.2 % (ref 0.0–3.0)
Basophils Absolute: 0 10*3/uL (ref 0.0–0.1)
Eosinophils Absolute: 0 10*3/uL (ref 0.0–0.7)
Eosinophils Relative: 0.1 % (ref 0.0–5.0)
HCT: 39.1 % (ref 36.0–46.0)
Hemoglobin: 13.1 g/dL (ref 12.0–15.0)
LYMPHS ABS: 0.7 10*3/uL (ref 0.7–4.0)
Lymphocytes Relative: 3.6 % — ABNORMAL LOW (ref 12.0–46.0)
MCHC: 33.4 g/dL (ref 30.0–36.0)
MCV: 88.2 fl (ref 78.0–100.0)
Monocytes Absolute: 0.6 10*3/uL (ref 0.1–1.0)
Monocytes Relative: 3.2 % (ref 3.0–12.0)
NEUTROS ABS: 18.7 10*3/uL — AB (ref 1.4–7.7)
NEUTROS PCT: 92.9 % — AB (ref 43.0–77.0)
Platelets: 568 10*3/uL — ABNORMAL HIGH (ref 150.0–400.0)
RBC: 4.43 Mil/uL (ref 3.87–5.11)
RDW: 15.3 % (ref 11.5–15.5)
WBC: 20.1 10*3/uL (ref 4.0–10.5)

## 2018-08-11 LAB — COMPREHENSIVE METABOLIC PANEL
ALT: 13 U/L (ref 0–35)
AST: 17 U/L (ref 0–37)
Albumin: 3.9 g/dL (ref 3.5–5.2)
Alkaline Phosphatase: 69 U/L (ref 39–117)
BUN: 18 mg/dL (ref 6–23)
CO2: 33 mEq/L — ABNORMAL HIGH (ref 19–32)
Calcium: 9.9 mg/dL (ref 8.4–10.5)
Chloride: 99 mEq/L (ref 96–112)
Creatinine, Ser: 1.07 mg/dL (ref 0.40–1.20)
GFR: 50.21 mL/min — ABNORMAL LOW (ref >60.00)
Glucose, Bld: 104 mg/dL — ABNORMAL HIGH (ref 70–99)
Potassium: 5.1 mEq/L (ref 3.5–5.1)
Sodium: 142 mEq/L (ref 135–145)
Total Bilirubin: 0.4 mg/dL (ref 0.2–1.2)
Total Protein: 7 g/dL (ref 6.0–8.3)

## 2018-08-11 LAB — LIPASE: Lipase: 12 U/L (ref 11.0–59.0)

## 2018-08-11 LAB — AMYLASE: Amylase: 31 U/L (ref 27–131)

## 2018-08-11 MED ORDER — HYDROCODONE-ACETAMINOPHEN 10-325 MG PO TABS: ORAL_TABLET | ORAL | 0 refills | Status: DC

## 2018-08-11 MED ORDER — TIZANIDINE HCL 4 MG PO TABS: 4.0000 mg | ORAL_TABLET | Freq: Every day | ORAL | 0 refills | Status: AC

## 2018-08-11 NOTE — Progress Notes (Signed)
Subjective:    Patient ID: Kimberly Phillips, female    DOB: 1945/09/13, 73 y.o.   MRN: [id number]  HPI HPI  Pt in states she is using heart monitor. She states no abnormal heart beats since first day of monitor use.  Pt states she stopped xanax. She states she was getting nervous when her heart rate  was going up.    Pt noticed when she held thyroid dose and given lower dose her palpation/tachycardia symptoms resolved. She states was only getting nervous with increased hr.  Pt gets some cramps in legs at night and uses zanaflex.(she also uses something else that she gets from speciality pharmacy).  She used trazadone for insomnia.  Pt had some residual cough from possible pneumonia. Pt was given ceftin twice a day. Pulmonlogist had given her this. Pt was also on prednisone by specialist. She also sees immune specialist.  Coughing has flared. Yesterday I had called in cough syrup. She states she does not use cough syrups. She states she use norco for cough. Also use for ha. On discussion over the years notes when using norco for neck pain and ha that it can suppress cough.  Pt last Ct of chest 07-24-2018.  Sequela of chronic atypical mycobacterial infection, overall mildly improved, although now with progressive right middle lobe atelectasis/collapse.  Aortic Atherosclerosis (ICD10-I70.0).  I talked with pharmacist yesterday. She had rx of 90 tabs every month. She thinks now she only needs twice a day. She has hx of neck pain. Also herniated disk about 10 years ago. Leaning over neck hurts. She state late in day will get rt occipital area pain. Pain will radiate to frontal area. States ha occur daily. Hx of concussion after 10 years ago. This is when pain started.  Hx of some weight loss in winter when has to use more prednisone. Some upset stomach with meds and eating. Hx of reflux but not reporting obvious gerd symptoms. She assures me usually in spring will gain weight back.   Review of  Systems  Constitutional: Negative for chills, fatigue and fever.  Respiratory: Negative for cough, chest tightness, shortness of breath and wheezing.   Cardiovascular: Negative for chest pain and palpitations.       Objective:   Physical Exam  General  Mental Status - Alert. General Appearance - Well groomed. Not in acute distress.  Skin Rashes- No Rashes.  HEENT Head- Normal. Ear Auditory Canal - Left- Normal. Right - Normal.Tympanic Membrane- Left- Normal. Right- Normal. Eye Sclera/Conjunctiva- Left- Normal. Right- Normal. Nose & Sinuses Nasal Mucosa- Left-  Boggy and Congested. Right-  Boggy and  Congested.Bilateral maxillary and frontal sinus pressure. Mouth & Throat Lips: Upper Lip- Normal: no dryness, cracking, pallor, cyanosis, or vesicular eruption. Lower Lip-Normal: no dryness, cracking, pallor, cyanosis or vesicular eruption. Buccal Mucosa- Bilateral- No Aphthous ulcers. Oropharynx- No Discharge or Erythema. Tonsils: Characteristics- Bilateral- No Erythema or Congestion. Size/Enlargement- Bilateral- No enlargement. Discharge- bilateral-None.  Neck Neck- Supple. No Masses. Left side trapezius tenderness.   Chest and Lung Exam Auscultation: Breath Sounds:-Clear even and unlabored.  Cardiovascular Auscultation:Rythm- Regular, rate and rhythm. Murmurs & Other Heart Sounds:Ausculatation of the heart reveal- No Murmurs.  Lymphatic Head & Neck General Head & Neck Lymphatics: Bilateral: Description- No Localized lymphadenopathy.   Review of Systems  Constitutional: Negative for chills and fatigue.  HENT: Negative for congestion, drooling, ear discharge, ear pain, facial swelling and rhinorrhea.   Respiratory: Positive for cough. Negative for chest tightness, shortness of breath and  wheezing.        See hpi.  Cardiovascular: Negative for chest pain and palpitations.  Gastrointestinal: Negative for abdominal pain.       See hpi.  Genitourinary: Negative for  dysuria and enuresis.  Musculoskeletal: Positive for neck pain. Negative for arthralgias, back pain, gait problem, joint swelling, myalgias and neck stiffness.  Skin: Negative for rash.  Neurological: Positive for headaches. Negative for tremors, seizures, syncope, weakness and numbness.       See hpi  Hematological: Negative for adenopathy. Does not bruise/bleed easily.  Psychiatric/Behavioral: Negative for behavioral problems and confusion.              Assessment & Plan:  For your history of chronic neck pain with daily headaches, I do want you to get cervical spine x-ray today.  I am giving you 5-day prescription of Norco to use if needed.  Will discuss your neck pain and headache history with 1 of my supervising physicians and see if they are okay with you getting getting 60 tablets a month and following through with our [place] state controlled medication guidelines.  Glad that you were able to stop Xanax.  For your cough, would recommend that you continue to see your pulmonologist.  Norco might be able to give you some secondary benefit of reducing cough reflex.  You report history of weight loss historically in the winter months.  But I do want to get CBC, CMP, amylase and lipase today.  You are convinced that you will regain the weight back in the spring.  We discussed weight loss today.  If you do not gain weight back then would want to expand work-up such as getting repeat colonoscopy, making sure you are up-to-date on your mammogram and possibly doing CT of abdomen and pelvis.  Follow-up date to be determined after lab review and after discussion on Norco with supervising MD  40 minutes spent with patient.  50% of time spent counseling patient on approach going forward regarding her chronic headaches and neck pain.  Explained to her a process going forward and that I would need to get advice from my supervising MDs in light of the number of Norco she would need as well as  dosing.

## 2018-08-11 NOTE — Telephone Encounter (Signed)
CRITICAL VALUE STICKER  CRITICAL VALUE: WBC 20.1  RECEIVER (on-site recipient of call): Kelle Darting, CMA (AAMA)  DATE & TIME NOTIFIED: 08/11/18 @ 4:40pm  MESSENGER (representative from lab): Santiago Glad  MD NOTIFIED:  Mackie Pai, PA / Delana Meyer (CMA)  TIME OF NOTIFICATION: 4:40pm  RESPONSE:

## 2018-08-11 NOTE — Telephone Encounter (Signed)
Rx tinazadine sent to pt pharmacy.

## 2018-08-11 NOTE — Patient Instructions (Signed)
For your history of chronic neck pain with daily headaches, I do want you to get cervical spine x-ray today.  I am giving you 5-day prescription of Norco to use if needed.  Will discuss your neck pain and headache history with 1 of my supervising physicians and see if they are okay with you getting getting 60 tablets a month and following through with our [place] state controlled medication guidelines.  Glad that you were able to stop Xanax.  For your cough, would recommend that you continue to see your pulmonologist.  Norco might be able to give you some secondary benefit of reducing cough reflex.  You report history of weight loss historically in the winter months.  But I do want to get CBC, CMP, amylase and lipase today.  You are convinced that you will regain the weight back in the spring.  We discussed weight loss today.  If you do not gain weight back then would want to expand work-up such as getting repeat colonoscopy, making sure you are up-to-date on your mammogram and possibly doing CT of abdomen and pelvis.  Follow-up date to be determined after lab review and after discussion on Norco with supervising MDs.

## 2018-08-11 NOTE — Telephone Encounter (Signed)
Called pt and discussed with her tonight, see result note on lab.

## 2018-08-16 ENCOUNTER — Telehealth: Payer: Self-pay | Admitting: Medical

## 2018-08-16 ENCOUNTER — Telehealth: Payer: Self-pay | Admitting: *Deleted

## 2018-08-16 NOTE — Telephone Encounter (Signed)
Received Medical records from [facility]; forwarded to provider/SLS 01/27

## 2018-08-16 NOTE — Telephone Encounter (Signed)
Copied from [place] [id number]. Topic: Quick Communication - Rx Refill/Question >> Aug 16, 2018  2:01 PM Scherrie Gerlach wrote: Medication: HYDROcodone-acetaminophen (NORCO) 10-325 MG tablet  Pt following up on new Rx for this med to be sent in.  Pt states Percell Miller going to check with her other dr, and then call this in.  [facility], [place] - [address] [phone] (Phone) [phone] (Fax)

## 2018-08-16 NOTE — Telephone Encounter (Signed)
I talked with Dr. Etter Sjogren regarding her history of ha, neck pan and use of norco daily for this. She explained she would be in agreement that patient use norco twice daily as needed or her ha and neck pain but with understanding of below.  1. She signs controlled med contract and give uds.  2. We refer her to neurologist HA specialist to get their opinion on best treatment regimen. This could take up to 3-6 months. We would give medication during the interim. We would follow neurologist advise on treatment regimen. But if they don't think norco good long term option then we stop prescribing.  Reminder not using xanax any more. She had already stopped that but make sure she is aware.  Let me know what she says. That way I will go ahead and put in referral to neurologist/ha specialist.

## 2018-08-17 ENCOUNTER — Telehealth: Payer: Self-pay | Admitting: Medical

## 2018-08-17 MED ORDER — HYDROCODONE-ACETAMINOPHEN 10-325 MG PO TABS: ORAL_TABLET | ORAL | 0 refills | Status: DC

## 2018-08-17 NOTE — Telephone Encounter (Signed)
Request addressed in telephone encounter.

## 2018-08-17 NOTE — Telephone Encounter (Signed)
I will give her 5 day supply of norco per [place] law rules.  I can give her 30 days supply only if she comes in for uds and contract.  She states that she is going back to former MD and not accepting referral to neurologist.. So if appointment to get rescheduled takes a while then one month script might be beneficial for her.   So if she wants she can get scheduled to come in for contract and uds.

## 2018-08-17 NOTE — Telephone Encounter (Signed)
Rx norco sent to pt pharmacy.

## 2018-08-17 NOTE — Telephone Encounter (Signed)
Let message for to call back. Okay for PEC to fall back.

## 2018-08-17 NOTE — Telephone Encounter (Signed)
Message from E. Saguier read to patient.  Requesting "Someone call in my Norco today." States she will come in for paperwork, UDS  "Whatever I need to do." States she has already been seen by specialist. States she is not sleeping and still in pain.  "I've been waiting for weeks for this medication." Pt with angry affect. States she will be going back to previous physician.

## 2018-08-18 NOTE — Telephone Encounter (Signed)
Notified pt of message below pt states she has already been to a neurologist. Pt states she will come in whenever she has time. Also notified pt she will not be able to get a 30 day supply until she comes in to do UDS and contract.

## 2018-08-19 ENCOUNTER — Telehealth: Payer: Self-pay | Admitting: Cardiology

## 2018-08-19 NOTE — Telephone Encounter (Signed)
Patient mailed in her ZIO patch monitor today and we are waiting on the results to come back before medication changes will be made. Patient angrily states she has not been able to sleep for the past week and her new PCP, Ed Architect, PA, took her off of her xanax and started her on trazodone to help her sleep. Ever since this change, she has had increased heart palpitations and she is very anxious. She is not happy with the medication change and is demanding a new medication be prescribed to help her sleep and to decrease the occurrence of palpitations. Advised patient that she would need to contact her PCP (current or previous if she wished) regarding sleep medications and we would reach out to her as soon as monitor results are available. Patient responded with, "Okay, I guess I will have to contact my old PCP from [facility]" and abruptly ended the call.

## 2018-08-19 NOTE — Telephone Encounter (Signed)
Patient heart is racing, she reports she has not slept. She just took off her heart monitor but cannot go without sleep until monitor results are in. She would like something called in to help her sleep. Please advise.

## 2018-08-25 ENCOUNTER — Ambulatory Visit: Payer: Self-pay

## 2018-08-25 NOTE — Telephone Encounter (Signed)
Returned call to patient who says since Monday night she has been having diarrhea.  She states that is is water and uncontrollable. She has also started to vomit yesterday. She states she has been voiding with each stool.  She has been siping on water and coke.  Her daughter gave her some medication that has slowed everything.  She has not had to vomit or had diarrhea for at least 2 hours. She states that she feel a little lightheaded. Per protocol pt should go to ER for evaluation of dehydration. Pt refuses stating she can't go anywhere because of the diarrhea.  She states her daughter is a Marine scientist and has told her about dehydration and will watch her for symptoms.  Pt also says her family has all had DX of Norovirus. Last was about 2 weeks ago. Care advice read to patient. Will route request for phenergan to office per pt request. Reason for Disposition . [1] Drinking very little AND [2] dehydration suspected (e.g., no urine > 12 hours, very dry mouth, very lightheaded)  Answer Assessment - Initial Assessment Questions 1. DIARRHEA SEVERITY: "How bad is the diarrhea?" "How many extra stools have you had in the past 24 hours than normal?"    - NO DIARRHEA (SCALE 0)   - MILD (SCALE 1-3): Few loose or mushy BMs; increase of 1-3 stools over normal daily number of stools; mild increase in ostomy output.   -  MODERATE (SCALE 4-7): Increase of 4-6 stools daily over normal; moderate increase in ostomy output. * SEVERE (SCALE 8-10; OR 'WORST POSSIBLE'): Increase of 7 or more stools daily over normal; moderate increase in ostomy output; incontinence.     8-10 2. ONSET: "When did the diarrhea begin?"     Monday night 3. BM CONSISTENCY: "How loose or watery is the diarrhea?"      water 4. VOMITING: "Are you also vomiting?" If so, ask: "How many times in the past 24 hours?"      During the night last night 5. ABDOMINAL PAIN: "Are you having any abdominal pain?" If yes: "What does it feel like?" (e.g., crampy,  dull, intermittent, constant)      cramping 6. ABDOMINAL PAIN SEVERITY: If present, ask: "How bad is the pain?"  (e.g., Scale 1-10; mild, moderate, or severe)   - MILD (1-3): doesn't interfere with normal activities, abdomen soft and not tender to touch    - MODERATE (4-7): interferes with normal activities or awakens from sleep, tender to touch    - SEVERE (8-10): excruciating pain, doubled over, unable to do any normal activities       8-10 7. ORAL INTAKE: If vomiting, "Have you been able to drink liquids?" "How much fluids have you had in the past 24 hours?"     Last hour has kept coke down 8. HYDRATION: "Any signs of dehydration?" (e.g., dry mouth [not just dry lips], too weak to stand, dizziness, new weight loss) "When did you last urinate?"     1 hour ago feels dizzy since Monday night 9. EXPOSURE: "Have you traveled to a foreign country recently?" "Have you been exposed to anyone with diarrhea?" "Could you have eaten any food that was spoiled?"     Yes granddaughter norovirus about 2 weeks ago 10. ANTIBIOTIC USE: "Are you taking antibiotics now or have you taken antibiotics in the past 2 months?"       no 11. OTHER SYMPTOMS: "Do you have any other symptoms?" (e.g., fever, blood in stool)  no 12. PREGNANCY: "Is there any chance you are pregnant?" "When was your last menstrual period?"       N/A  Protocols used: DIARRHEA-A-AH

## 2018-08-26 ENCOUNTER — Telehealth: Payer: Self-pay

## 2018-08-26 MED ORDER — ACEBUTOLOL HCL 200 MG PO CAPS: 200.0000 mg | ORAL_CAPSULE | Freq: Every day | ORAL | 1 refills | Status: DC

## 2018-08-26 NOTE — Telephone Encounter (Signed)
-----   Message from Richardo Priest, MD sent at 08/26/2018  1:15 PM EST ----- Normal or stable result  She is having frequent episodes of brief rapid heart rhythm continue diltiazem I would also like her to start Sectral 200 mg daily and I think it would give her good relief dispense 30 or 90 refill 3 or 1 1 daily

## 2018-08-26 NOTE — Telephone Encounter (Signed)
Patient aware that she is having frequent episodes of rapid heart beats per Dr Bettina Gavia.  Patient advised to continue diltiazem, and start acebutolol 200mg  1 capsule daily.  Rx sent to pharmacy.   Patient agreed to plan and verbalized understanding.

## 2018-08-27 MED ORDER — PROMETHAZINE HCL 12.5 MG PO TABS: 12.5000 mg | ORAL_TABLET | Freq: Three times a day (TID) | ORAL | 0 refills | Status: DC | PRN

## 2018-08-27 NOTE — Addendum Note (Signed)
Addended by: Anabel Halon on: 08/27/2018 08:43 AM   Modules accepted: Orders

## 2018-08-27 NOTE — Telephone Encounter (Signed)
I sent in phenergan but reviwed RN notes and agree with her described symptoms that emergency department is sounds appropiate. Sounds like she needs gasto panel to evaluate cause for watery diarrhea.

## 2018-08-30 NOTE — Progress Notes (Deleted)
Cardiology Office Note:    Date:  08/30/2018   ID:  Kimberly Phillips, DOB Sep 02, 1945, MRN [id number]  PCP:  Mackie Pai, PA-C  Cardiologist:  Shirlee More, MD    Referring MD: Maylon Peppers, MD    ASSESSMENT:    No diagnosis found. PLAN:    In order of problems listed above:  1. ***   Next appointment: ***   Medication Adjustments/Labs and Tests Ordered: Current medicines are reviewed at length with the patient today.  Concerns regarding medicines are outlined above.  No orders of the defined types were placed in this encounter.  No orders of the defined types were placed in this encounter.   No chief complaint on file.   History of Present Illness:    Kimberly Phillips is a 73 y.o. female with a hx of SVT and thyroid disease last seen 07/20/18. Compliance with diet, lifestyle and medications: *** Past Medical History:  Diagnosis Date  . Arthritis   . Blood transfusion without reported diagnosis   . COPD (chronic obstructive pulmonary disease) ([place])   . Hashimoto's disease   . Immune deficiency disorder ([place])   . Pneumonia   . SVT (supraventricular tachycardia) ([place])   . TIA (transient ischemic attack)     Past Surgical History:  Procedure Laterality Date  . ABDOMINAL HYSTERECTOMY    . APPENDECTOMY    . CATARACT EXTRACTION Bilateral   . CHOLECYSTECTOMY    . TONSILLECTOMY      Current Medications: No outpatient medications have been marked as taking for the 08/31/18 encounter (Appointment) with Richardo Priest, MD.     Allergies:   Codeine; Gamma globulin [immune globulin]; Levofloxacin; Moxifloxacin; Piperacillin-tazobactam in dex; Ciprofloxacin; Liothyronine; Macrolides and ketolides; Tape; Telbivudine; Troleandomycin; Amoxicillin-pot clavulanate; Atenolol; Nitrofurantoin; and Sulfa antibiotics   Social History   Socioeconomic History  . Marital status: Widowed    Spouse name: Not on file  . Number of children: Not on file  . Years of education: Not on  file  . Highest education level: Not on file  Occupational History  . Not on file  Social Needs  . Financial resource strain: Not on file  . Food insecurity:    Worry: Not on file    Inability: Not on file  . Transportation needs:    Medical: Not on file    Non-medical: Not on file  Tobacco Use  . Smoking status: Former Research scientist (life sciences)  . Smokeless tobacco: Never Used  Substance and Sexual Activity  . Alcohol use: No  . Drug use: No  . Sexual activity: Not Currently  Lifestyle  . Physical activity:    Days per week: Not on file    Minutes per session: Not on file  . Stress: Not on file  Relationships  . Social connections:    Talks on phone: Not on file    Gets together: Not on file    Attends religious service: Not on file    Active member of club or organization: Not on file    Attends meetings of clubs or organizations: Not on file    Relationship status: Not on file  Other Topics Concern  . Not on file  Social History Narrative  . Not on file     Family History: The patient's ***family history includes CAD (age of onset: 69) in her father; Cancer in her mother; Diabetes in her maternal grandmother; Heart attack in her father. ROS:   Please see the history of present illness.  All other systems reviewed and are negative.  EKGs/Labs/Other Studies Reviewed:    The following studies were reviewed today:  EKG:  EKG ordered today.  The ekg ordered today demonstrates ***  Recent Labs: 10/30/2017: Magnesium 2.2 07/28/2018: TSH 0.48 08/11/2018: ALT 13; BUN 18; Creatinine, Ser 1.07; Hemoglobin 13.1; Platelets 568.0; Potassium 5.1; Sodium 142  Recent Lipid Panel    Component Value Date/Time   CHOL 146 10/30/2017 0318   TRIG 64 10/30/2017 0318   HDL 42 10/30/2017 0318   CHOLHDL 3.5 10/30/2017 0318   VLDL 13 10/30/2017 0318   LDLCALC 91 10/30/2017 0318    Physical Exam:    VS:  There were no vitals taken for this visit.    Wt Readings from Last 3 Encounters:    08/11/18 111 lb 6.4 oz (50.5 kg)  07/28/18 113 lb (51.3 kg)  07/20/18 113 lb 1.9 oz (51.3 kg)     GEN: *** Well nourished, well developed in no acute distress HEENT: Normal NECK: No JVD; No carotid bruits LYMPHATICS: No lymphadenopathy CARDIAC: ***RRR, no murmurs, rubs, gallops RESPIRATORY:  Clear to auscultation without rales, wheezing or rhonchi  ABDOMEN: Soft, non-tender, non-distended MUSCULOSKELETAL:  No edema; No deformity  SKIN: Warm and dry NEUROLOGIC:  Alert and oriented x 3 PSYCHIATRIC:  Normal affect    Signed, Shirlee More, MD  08/30/2018 5:15 PM    [facility]

## 2018-08-31 ENCOUNTER — Ambulatory Visit: Payer: Medicare Other | Admitting: Cardiology

## 2018-09-16 ENCOUNTER — Ambulatory Visit (INDEPENDENT_AMBULATORY_CARE_PROVIDER_SITE_OTHER): Payer: Medicare Other | Admitting: Cardiology

## 2018-09-16 ENCOUNTER — Encounter: Payer: Self-pay | Admitting: Cardiology

## 2018-09-16 VITALS — BP 132/88 | HR 85 | Ht 67.0 in | Wt 110.8 lb

## 2018-09-16 DIAGNOSIS — J479 Bronchiectasis, uncomplicated: Secondary | ICD-10-CM | POA: Insufficient documentation

## 2018-09-16 DIAGNOSIS — I4719 Other supraventricular tachycardia: Secondary | ICD-10-CM

## 2018-09-16 DIAGNOSIS — E063 Autoimmune thyroiditis: Secondary | ICD-10-CM

## 2018-09-16 DIAGNOSIS — E038 Other specified hypothyroidism: Secondary | ICD-10-CM | POA: Diagnosis not present

## 2018-09-16 DIAGNOSIS — J471 Bronchiectasis with (acute) exacerbation: Secondary | ICD-10-CM

## 2018-09-16 DIAGNOSIS — I491 Atrial premature depolarization: Secondary | ICD-10-CM

## 2018-09-16 DIAGNOSIS — I471 Supraventricular tachycardia: Secondary | ICD-10-CM

## 2018-09-16 HISTORY — DX: Supraventricular tachycardia: I47.1

## 2018-09-16 HISTORY — DX: Atrial premature depolarization: I49.1

## 2018-09-16 HISTORY — DX: Bronchiectasis, uncomplicated: J47.9

## 2018-09-16 HISTORY — DX: Bronchiectasis with (acute) exacerbation: J47.1

## 2018-09-16 HISTORY — DX: Other supraventricular tachycardia: I47.19

## 2018-09-16 NOTE — Progress Notes (Signed)
Cardiology Office Note:    Date:  09/16/2018   ID:  Kimberly Phillips, DOB 05/15/1946, MRN [id number]  PCP:  Kimberly Pai, PA-C  Cardiologist:  Kimberly More, MD    Referring MD: Kimberly Pai, PA-C    ASSESSMENT:    1. APC (atrial premature contractions)   2. PAT (paroxysmal atrial tachycardia) ([place])   3. Bronchiectasis with acute exacerbation ([place])   4. Hypothyroidism due to Hashimoto's thyroiditis    PLAN:    In order of problems listed above:  1. She is improved has had a very nice subjective response with resolution of her symptoms of palpitation due to frequent and repetitive APCs and atrial tachycardia and tolerates her calcium channel blocker and selective beta-blocker will continue the same. 2. Recently worsened hospitalized has been seen by pulmonary team up with a strategy to treat her Pseudomonas infection 3. Recheck thyroid studies after decreasing dose of supplement with symptomatic hyperthyroidism   Next appointment: 6 months   Medication Adjustments/Labs and Tests Ordered: Current medicines are reviewed at length with the patient today.  Concerns regarding medicines are outlined above.  Orders Placed This Encounter  Procedures  . TSH+T4F+T3Free   No orders of the defined types were placed in this encounter.   No chief complaint on file.   History of Present Illness:    Kimberly Phillips is a 73 y.o. female with a hx of palpitation, repetitive APC's and brief PAT  last seen 07/20/18.She had an admission to  to [facility] with chest pain October 2019 and ruled out. Echocardiogram April 2019 showed normal LV function and mild diastolic dysfunction. There are study April 2019 showed ejection fraction 67%. Normal perfusion. Also with history of supraventricular tachycardia/ectopic atrial tachycardia treated with Cardizem. Compliance with diet, lifestyle and medications: Yes  Unfortunately she continues to be chronically ill and sick with bronchiectasis and  Pseudomonas super imposed infection and recent hospitalization with respiratory failure [facility]  Fortunately her arrhythmia has resolved with low-dose selective beta-blocker.  No chest pain edema orthopnea but still has cough and shortness of breath related to lung disease. Past Medical History:  Diagnosis Date  . Arthritis   . Blood transfusion without reported diagnosis   . COPD (chronic obstructive pulmonary disease) ([place])   . Hashimoto's disease   . Immune deficiency disorder ([place])   . Pneumonia   . SVT (supraventricular tachycardia) ([place])   . TIA (transient ischemic attack)     Past Surgical History:  Procedure Laterality Date  . ABDOMINAL HYSTERECTOMY    . APPENDECTOMY    . CATARACT EXTRACTION Bilateral   . CHOLECYSTECTOMY    . TONSILLECTOMY      Current Medications: Current Meds  Medication Sig  . acebutolol (SECTRAL) 200 MG capsule Take 1 capsule (200 mg total) by mouth daily.  . B Complex-C-Folic Acid (FOLBEE PLUS) TABS Take 1 tablet by mouth daily with lunch.   . Calcium Carb-Cholecalciferol (CALCIUM 600-D PO) Take 1,200 mg by mouth daily with lunch.  . Dietary Management Product (VASCULERA) TABS Take 630 mg by mouth at bedtime. For spasms/cramps in feet and legs  . diltiazem (CARDIZEM CD) 180 MG 24 hr capsule TAKE 1 CAPSULE BY MOUTH EVERY DAY  . estradiol (ESTRACE) 0.5 MG tablet Take 0.5 mg by mouth daily with lunch.   Marland Kitchen HYDROcodone-acetaminophen (NORCO) 10-325 MG tablet 1 tab po bid prn ha or neck pain  . levothyroxine (SYNTHROID, LEVOTHROID) 75 MCG tablet Take 1 tablet (75 mcg total) by mouth daily  before breakfast.  . magnesium oxide (MAG-OX) 400 MG tablet Take 400 mg by mouth at bedtime.  . predniSONE (DELTASONE) 10 MG tablet Take 10 mg by mouth daily.   Marland Kitchen tiZANidine (ZANAFLEX) 4 MG tablet Take 1 tablet (4 mg total) by mouth at bedtime.  . traZODone (DESYREL) 100 MG tablet Take 100 mg by mouth at bedtime.   Marland Kitchen ZINC SULFATE PO Take 1 tablet by mouth  daily.  . [DISCONTINUED] propranolol (INDERAL) 10 MG tablet Take 10 mg by mouth daily.     Allergies:   Codeine; Minocycline; Gamma globulin [immune globulin]; Levofloxacin; Moxifloxacin; Piperacillin-tazobactam in dex; Ciprofloxacin; Liothyronine; Macrolides and ketolides; Oseltamivir; Tape; Telbivudine; Troleandomycin; Amoxicillin-pot clavulanate; Atenolol; Nitrofurantoin; and Sulfa antibiotics   Social History   Socioeconomic History  . Marital status: Widowed    Spouse name: Not on file  . Number of children: Not on file  . Years of education: Not on file  . Highest education level: Not on file  Occupational History  . Not on file  Social Needs  . Financial resource strain: Not on file  . Food insecurity:    Worry: Not on file    Inability: Not on file  . Transportation needs:    Medical: Not on file    Non-medical: Not on file  Tobacco Use  . Smoking status: Former Research scientist (life sciences)  . Smokeless tobacco: Never Used  Substance and Sexual Activity  . Alcohol use: No  . Drug use: No  . Sexual activity: Not Currently  Lifestyle  . Physical activity:    Days per week: Not on file    Minutes per session: Not on file  . Stress: Not on file  Relationships  . Social connections:    Talks on phone: Not on file    Gets together: Not on file    Attends religious service: Not on file    Active member of club or organization: Not on file    Attends meetings of clubs or organizations: Not on file    Relationship status: Not on file  Other Topics Concern  . Not on file  Social History Narrative  . Not on file     Family History: The patient's family history includes CAD (age of onset: 40) in her father; Cancer in her mother; Diabetes in her maternal grandmother; Heart attack in her father. ROS:   Please see the history of present illness.    All other systems reviewed and are negative.  EKGs/Labs/Other Studies Reviewed:    The following studies were reviewed today:  EKG: [facility] 08/29/2018 showed sinus rhythm normal EKG normal conduction intervals.  Recent Labs: 10/30/2017: Magnesium 2.2 07/28/2018: TSH 0.48 08/11/2018: ALT 13; BUN 18; Creatinine, Ser 1.07; Hemoglobin 13.1; Platelets 568.0; Potassium 5.1; Sodium 142  Recent Lipid Panel    Component Value Date/Time   CHOL 146 10/30/2017 0318   TRIG 64 10/30/2017 0318   HDL 42 10/30/2017 0318   CHOLHDL 3.5 10/30/2017 0318   VLDL 13 10/30/2017 0318   LDLCALC 91 10/30/2017 0318    Physical Exam:    VS:  BP 132/88 (BP Location: Right Arm, Patient Position: Sitting, Cuff Size: Normal)   Pulse 85   Ht 5\' 7"  (1.702 m)   Wt 110 lb 12.8 oz (50.3 kg)   SpO2 96%   BMI 17.35 kg/m     Wt Readings from Last 3 Encounters:  09/16/18 110 lb 12.8 oz (50.3 kg)  08/11/18 111 lb 6.4 oz (50.5  kg)  07/28/18 113 lb (51.3 kg)     GEN: looks chronically ill  Well nourished, well developed in no acute distress HEENT: Normal NECK: No JVD; No carotid bruits LYMPHATICS: No lymphadenopathy CARDIAC: RRR, no murmurs, rubs, gallops RESPIRATORY:  Clear to auscultation without rales, wheezing or rhonchi  ABDOMEN: Soft, non-tender, non-distended MUSCULOSKELETAL:  No edema; No deformity  SKIN: Warm and dry NEUROLOGIC:  Alert and oriented x 3 PSYCHIATRIC:  Normal affect    Signed, Kimberly More, MD  09/16/2018 1:44 PM    [facility]

## 2018-09-16 NOTE — Patient Instructions (Addendum)
Medication Instructions:  Your physician has recommended you make the following change in your medication:   STOP propranolol   If you need a refill on your cardiac medications before your next appointment, please call your pharmacy.   Lab work: Your physician recommends that you return for lab work today: TSH, T4, T3.   If you have labs (blood work) drawn today and your tests are completely normal, you will receive your results only by: Marland Kitchen MyChart Message (if you have MyChart) OR . A paper copy in the mail If you have any lab test that is abnormal or we need to change your treatment, we will call you to review the results.  Testing/Procedures: None  Follow-Up: At [facility], you and your health needs are our priority.  As part of our continuing mission to provide you with exceptional heart care, we have created designated Provider Care Teams.  These Care Teams include your primary Cardiologist (physician) and Advanced Practice Providers (APPs -  Physician Assistants and Nurse Practitioners) who all work together to provide you with the care you need, when you need it. You will need a follow up appointment in 6 months.

## 2018-09-17 LAB — TSH+T4F+T3FREE
Free T4: 1.21 ng/dL (ref 0.82–1.77)
T3, Free: 2 pg/mL (ref 2.0–4.4)
TSH: 10.99 u[IU]/mL — ABNORMAL HIGH (ref 0.450–4.500)

## 2018-09-19 ENCOUNTER — Telehealth: Payer: Self-pay | Admitting: Medical

## 2018-09-19 NOTE — Telephone Encounter (Signed)
Pt stated on 08-17-2018 phone call with staff she was leaving practice. Only have seen 10 or 2 times. She was unhappy with me not prescribing her controlled meds as former pcp did. I took myself off as her pcp. Do I need to send a letter as well as she made decision to leave?

## 2018-09-19 NOTE — Telephone Encounter (Signed)
I took myself off as her pcp. On 08-17-2018 she told staff leaving our practice. I only saw her once or twice. She was upset about me not prescribing her controlled meds as former pcp did. I took myself off as her pcp. Do I need to send her formal letter.

## 2018-09-23 ENCOUNTER — Telehealth: Payer: Self-pay | Admitting: Medical

## 2018-09-23 NOTE — Telephone Encounter (Signed)
Pt briefly did mention on conversation with RN that she was going to return to former MD practice. She mentioned this since I was not readily filling controlled medcation. I just want to clarify if she already established new pcp or did she just say that but was not serious.   I have take myself off as her pcp based on these comments.   Difficult to find this in epic. You have to go in on 08-17-2018 notes and scroll down.  Thanks, Mackie Pai, PA-C

## 2018-09-23 NOTE — Telephone Encounter (Signed)
[place], I took myself off as her pcp based on her 08-17-2018 comment that she was going back to her former provider/MD. This appears to be adequate reason? Did you see that note in epic?  If you could help and clarify whether if she meant that? Could you call her and inquiry. If she already went somewhere else then will nothing needs to be done and I took myself off as her pcp?  Or I will just send letter and give reason as non therapeutic relationship.  Let me know what you think.  Thanks, Mackie Pai, PA-C

## 2018-10-21 DIAGNOSIS — F1320 Sedative, hypnotic or anxiolytic dependence, uncomplicated: Secondary | ICD-10-CM | POA: Insufficient documentation

## 2018-10-21 HISTORY — DX: Sedative, hypnotic or anxiolytic dependence, uncomplicated: F13.20

## 2018-10-26 ENCOUNTER — Other Ambulatory Visit: Payer: Self-pay | Admitting: Medical

## 2018-10-26 ENCOUNTER — Telehealth: Payer: Self-pay | Admitting: Cardiology

## 2018-10-27 ENCOUNTER — Telehealth: Payer: Self-pay | Admitting: Cardiology

## 2018-10-27 ENCOUNTER — Other Ambulatory Visit: Payer: Self-pay | Admitting: Cardiology

## 2018-10-27 MED ORDER — ACEBUTOLOL HCL 200 MG PO CAPS: ORAL_CAPSULE | ORAL | 1 refills | Status: DC

## 2018-10-27 NOTE — Telephone Encounter (Signed)
  °*  STAT* If patient is at the pharmacy, call can be transferred to refill team.   1. Which medications need to be refilled? (please list name of each medication and dose if known) Acebutolol 200mg  capsules once daily  2. Which pharmacy/location (including street and city if local pharmacy) is medication to be sent to?[facility]  3. Do they need a 30 day or 90 day supply? 90 with refills

## 2018-10-27 NOTE — Telephone Encounter (Signed)
Sent Acebutolol to [facility]

## 2018-10-27 NOTE — Telephone Encounter (Signed)
°*  STAT* If patient is at the pharmacy, call can be transferred to refill team.   1. Which medications need to be refilled? (please list name of each medication and dose if known) Acebutolol 200mg  capsules once daily  2. Which pharmacy/location (including street and city if local pharmacy) is medication to be sent to?[facility]  3. Do they need a 30 day or 90 day supply? 90 with refills

## 2019-03-10 ENCOUNTER — Ambulatory Visit: Payer: Medicare Other | Admitting: Cardiology

## 2019-03-31 ENCOUNTER — Encounter: Payer: Self-pay | Admitting: Family

## 2019-03-31 ENCOUNTER — Ambulatory Visit: Payer: Medicare Other | Admitting: Cardiology

## 2019-03-31 ENCOUNTER — Other Ambulatory Visit: Payer: Self-pay

## 2019-03-31 ENCOUNTER — Ambulatory Visit (INDEPENDENT_AMBULATORY_CARE_PROVIDER_SITE_OTHER): Payer: Medicare Other | Admitting: Family

## 2019-03-31 VITALS — BP 130/60 | HR 74 | Ht 67.0 in | Wt 124.4 lb

## 2019-03-31 DIAGNOSIS — E063 Autoimmune thyroiditis: Secondary | ICD-10-CM

## 2019-03-31 DIAGNOSIS — I471 Supraventricular tachycardia: Secondary | ICD-10-CM | POA: Diagnosis not present

## 2019-03-31 DIAGNOSIS — E038 Other specified hypothyroidism: Secondary | ICD-10-CM | POA: Diagnosis not present

## 2019-03-31 DIAGNOSIS — I491 Atrial premature depolarization: Secondary | ICD-10-CM

## 2019-03-31 MED ORDER — DILTIAZEM HCL ER COATED BEADS 180 MG PO CP24: 180.0000 mg | ORAL_CAPSULE | Freq: Every day | ORAL | 1 refills | Status: DC

## 2019-03-31 MED ORDER — ACEBUTOLOL HCL 200 MG PO CAPS: 200.0000 mg | ORAL_CAPSULE | Freq: Every day | ORAL | 1 refills | Status: DC

## 2019-03-31 NOTE — Patient Instructions (Addendum)
Medication Instructions:  No medication changes today.   You may take an additional 0.5 tab or full tablet if you are having increased palpitations.   If you need a refill on your cardiac medications before your next appointment, please call your pharmacy.   Lab work: No lab work today.   If you have labs (blood work) drawn today and your tests are completely normal, you will receive your results only by: Marland Kitchen MyChart Message (if you have MyChart) OR . A paper copy in the mail If you have any lab test that is abnormal or we need to change your treatment, we will call you to review the results.  Testing/Procedures: None ordered today.   Follow-Up: At [facility], you and your health needs are our priority.  As part of our continuing mission to provide you with exceptional heart care, we have created designated Provider Care Teams.  These Care Teams include your primary Cardiologist (physician) and Advanced Practice Providers (APPs -  Physician Assistants and Nurse Practitioners) who all work together to provide you with the care you need, when you need it. You will need a follow up appointment in 6 months.  Please call our office 2 months in advance to schedule this appointment.  You may see Shirlee More, MD or another member of our [place] Provider Team in [place]: Jenne Campus, MD . Jyl Heinz, MD . Berniece Salines, MD . Laurann Montana, NP  Any Other Special Instructions Will Be Listed Below (If Applicable).  Medications and palpitations... 1. Avoid all over-the-counter antihistamines except Claritin/Loratadine and Zyrtec/Cetrizine. 2. Avoid all combination including cold sinus allergies flu decongestant and sleep medications 3. You can use Robitussin DM Mucinex and Mucinex DM for cough. 4. can use Tylenol aspirin ibuprofen and naproxen but no combinations such as sleep or sinus.

## 2019-03-31 NOTE — Progress Notes (Signed)
Office Visit    Patient Name: Kimberly Phillips Date of Encounter: 04/01/2019  Primary Care Provider:  Sinclair Ship, MD Primary Cardiologist:  Shirlee More, MD Electrophysiologist:  None   Chief Complaint    Kimberly Phillips is a 73 y.o. female with a hx of palpitations, repetitive PACs and prief PAT presents today for 6 month follow up of cardiac conditions.   Past Medical History    Past Medical History:  Diagnosis Date  . Arthritis   . Blood transfusion without reported diagnosis   . COPD (chronic obstructive pulmonary disease) ([place])   . Hashimoto's disease   . Immune deficiency disorder ([place])   . Pneumonia   . SVT (supraventricular tachycardia) ([place])   . TIA (transient ischemic attack)    Past Surgical History:  Procedure Laterality Date  . ABDOMINAL HYSTERECTOMY    . APPENDECTOMY    . CATARACT EXTRACTION Bilateral   . CHOLECYSTECTOMY    . TONSILLECTOMY      Allergies  Allergies  Allergen Reactions  . Codeine Itching  . Minocycline Other (See Comments) and Nausea And Vomiting  . Gamma Globulin [Immune Globulin] Nausea Only  . Levofloxacin Nausea And Vomiting  . Moxifloxacin Other (See Comments)    dizziness  . Piperacillin-Tazobactam In Dex Other (See Comments)     Red Man Syndrome (ALLERGY) Within minutes of getting Zosyn, pt flushed, red all over and buring sensation.   . Ciprofloxacin Other (See Comments)    hallucinations   . Liothyronine Other (See Comments)    dizziness  . Macrolides And Ketolides Other (See Comments)    Kidney problems  . Oseltamivir Other (See Comments)  . Tape Other (See Comments)    Skin tears  . Telbivudine Other (See Comments)    Caused Kidney problems  . Troleandomycin Other (See Comments)    Kidney problems  . Amoxicillin-Pot Clavulanate Diarrhea    Has patient had a PCN reaction causing immediate rash, facial/tongue/throat swelling, SOB or lightheadedness with hypotension: No Has patient had a PCN reaction causing severe  rash involving mucus membranes or skin necrosis: No Has patient had a PCN reaction that required hospitalization: Unknown Has patient had a PCN reaction occurring within the last 10 years: Unknown If all of the above answers are "NO", then may proceed with Cephalosporin use.  . Atenolol Nausea Only  . Nitrofurantoin Nausea Only  . Sulfa Antibiotics Rash    History of Present Illness    Kimberly Phillips is a 73 y.o. female with a hx of palpitations with known repetitive PACs and brief PAT/SVT managed on cardizem, hypothyroidism, bronchiectasis last seen by Dr. Bettina Gavia 09/16/2018.   Admitted to [facility] Oct 2019 with chest pain, ruled out for ACS. Echo April 2019 normal LVEF, mild diastolic dysfunction. Normal perfusion.   She reports she has been doing well. Has been enjoying spending time with her grandchildren and great grandchildren at her home during the summer. She has a pool which they enjoy swimming in. 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   Follows closely with her pulmonologist. She is looking for a new immunologist as her previous had retired. She is presently on a prednisone taper and notices some increased palpitation. She takes no pro-arrthymic medications.  She denies chest pain, chest pressure, edema, SOB, DOE.   EKGs/Labs/Other Studies Reviewed:   The following studies were reviewed today:  Long term monitor 08/05/18 A ZIO monitor was utilized 12 days 23 hours beginning 08/05/2018.  The rhythm throughout is  sinus with minimum average and maximum heart rates 42, 75 and 158 bpm.   There were no bradycardic episodes with pauses of 3 seconds or greater or sinus node or AV block.   Ventricular ectopy was rare less than 1%.   Supraventricular ectopy was also rare less than 1% however a high frequency of repetitive APCs was seen with 236 episodes a longest 11-1/2 seconds with a rate of 115 bpm and the fastest 5 complexes a rate of 250 bpm.  The episodes were paroxysmal  atrial tachycardia and there were no episodes of atrial fibrillation or flutter.   There were a total of 44 triggered events 11 diary events typically associated with supraventricular ectopy there was one diary event of chest pain unassociated with arrhythmia     Conclusion, frequent episodes of brief atrial tachycardia  Echo 10/30/17 Study Conclusions   - Left ventricle: The cavity size was normal. Wall thickness was   normal. Systolic function was normal. The estimated ejection   fraction was in the range of 55% to 60%. Wall motion was normal;   there were no regional wall motion abnormalities. Doppler   parameters are consistent with abnormal left ventricular   relaxation (grade 1 diastolic dysfunction). - Aortic valve: There was mild regurgitation.   Impressions:   - Normal LV systolic function; mild diastolic dysfunction; mild AI.  EKG:  EKG is ordered today.  The ekg ordered today demonstrates SR with sinus arrythmia rate 69bpm with no acute ST/T wave changes compared to previous.   Recent Labs: 08/11/2018: ALT 13; BUN 18; Creatinine, Ser 1.07; Hemoglobin 13.1; Platelets 568.0; Potassium 5.1; Sodium 142 09/16/2018: TSH 10.990  Recent Lipid Panel    Component Value Date/Time   CHOL 146 10/30/2017 0318   TRIG 64 10/30/2017 0318   HDL 42 10/30/2017 0318   CHOLHDL 3.5 10/30/2017 0318   VLDL 13 10/30/2017 0318   LDLCALC 91 10/30/2017 0318    Home Medications   Current Meds  Medication Sig  . acebutolol (SECTRAL) 200 MG capsule Take 1 capsule (200 mg total) by mouth daily.  . Calcium Carb-Cholecalciferol (CALCIUM 600-D PO) Take 1,200 mg by mouth daily with lunch.  . Dietary Management Product (VASCULERA) TABS Take 630 mg by mouth at bedtime. For spasms/cramps in feet and legs  . diltiazem (CARDIZEM CD) 180 MG 24 hr capsule Take 1 capsule (180 mg total) by mouth daily.  Marland Kitchen estradiol (ESTRACE) 0.5 MG tablet Take 0.5 mg by mouth daily with lunch.   . levothyroxine (SYNTHROID,  LEVOTHROID) 75 MCG tablet Take 1 tablet (75 mcg total) by mouth daily before breakfast. (Patient taking differently: Take 100 mcg by mouth daily before breakfast. )  . magnesium oxide (MAG-OX) 400 MG tablet Take 400 mg by mouth at bedtime.  . predniSONE (DELTASONE) 10 MG tablet Take 10 mg by mouth daily.   Marland Kitchen tiZANidine (ZANAFLEX) 4 MG tablet Take 1 tablet (4 mg total) by mouth at bedtime.  . traZODone (DESYREL) 100 MG tablet Take 100 mg by mouth at bedtime.   Marland Kitchen ZINC SULFATE PO Take 1 tablet by mouth daily.  . [DISCONTINUED] acebutolol (SECTRAL) 200 MG capsule TAKE 1 CAPSULE BY MOUTH EVERY DAY  . [DISCONTINUED] diltiazem (CARDIZEM CD) 180 MG 24 hr capsule TAKE 1 CAPSULE BY MOUTH EVERY DAY     Review of Systems      Review of Systems  Constitution: Negative for chills, fever and malaise/fatigue.  Cardiovascular: Positive for palpitations. Negative for chest pain, dyspnea on exertion,  leg swelling, near-syncope and orthopnea.  Respiratory: Negative for cough, shortness of breath and wheezing.   Gastrointestinal: Negative for nausea and vomiting.  Neurological: Negative for dizziness, light-headedness and weakness.   All other systems reviewed and are otherwise negative except as noted above.  Physical Exam    VS:  BP 130/60   Pulse 74   Ht 5\' 7"  (1.702 m)   Wt 124 lb 6.4 oz (56.4 kg)   SpO2 95%   BMI 19.48 kg/m  , BMI Body mass index is 19.48 kg/m. GEN: Well nourished, well developed, in no acute distress. HEENT: normal. Neck: Supple, no JVD, carotid bruits, or masses. Cardiac: RRR, no murmurs, rubs, or gallops. No clubbing, cyanosis, edema.  Radials/DP/PT 2+ and equal bilaterally.  Respiratory:  Respirations regular and unlabored, clear to auscultation bilaterally. GI: Soft, nontender, nondistended, BS + x 4. MS: No deformity or atrophy. Skin: Warm and dry, no rash. Neuro:  Strength and sensation are intact. Psych: Normal affect.  Accessory Clinical Findings    ECG  personally reviewed by me today - SR with sinus arrthymia (PAC) - no acute changes.  Assessment & Plan    1. Palpitations - Stable. Somewhat increased palpitations while on current steroid taper by pulmonology. Reports this is not bothersome and resolves when she completes her taper. Informed she can take and extra half-tablet or tablet of Acebutolol as needed for increased palpitations. Continue Cardizem and Acebutolol. Refills sent per her request.   2. APC - Stable. Plan, as above.   3. PAT - Stable. Plan, as above.   4. Hypothyroidism - Follows closely with her PCP. 12/15/18 Free T4 0.9, TSH 5.672.   5. Bronchiectasis/Recurrent pneumonia - Known history. Follows closely with immunology and pulmonology. She is presently on a steroid taper with which she notices increased palpitations.   Disposition: Follow up in 6 month(s) with Dr. Earney Navy, NP 04/01/2019, 7:52 AM

## 2019-04-04 NOTE — Addendum Note (Signed)
Addended by: Particia Nearing B on: 04/04/2019 09:05 AM   Modules accepted: Orders

## 2019-07-27 ENCOUNTER — Telehealth: Payer: Self-pay | Admitting: Cardiology

## 2019-07-27 MED ORDER — ACEBUTOLOL HCL 200 MG PO CAPS: 200.0000 mg | ORAL_CAPSULE | Freq: Two times a day (BID) | ORAL | 3 refills | Status: DC

## 2019-07-27 NOTE — Telephone Encounter (Signed)
I spoke to the patient who is still experiencing tachycardia, could not give me HR, and "racing" in her chest at her job and at night while sleeping.  She had an OV with Laurann Montana on 9/10.    She is still taking Cardizem 180 mg and Acebutolol 200 mg Daily.  She would like advisement if anything needs to be adjusted.  Please advise, thank you

## 2019-07-27 NOTE — Telephone Encounter (Signed)
Patient needs a phone call, still having tachycardia and wants to know about changing or increasing medicines.

## 2019-07-27 NOTE — Telephone Encounter (Signed)
I spoke to the patient with Caitlin's recommendations.  She verbalized understanding and will increase her Acebutolol to 200 mg bid.  The patient will try for a couple of weeks, call with an update and may schedule an appt with Dr Bettina Gavia.

## 2019-07-27 NOTE — Telephone Encounter (Signed)
Please have her increase her Acebutolol to 200mg  twice daily.  Remind her that caffeine, stress, alcohol, steroid taper (if one one recently with pulmonology) can all cause increased palpitations.   Please offer her a sooner appointment with Dr. Bettina Gavia in the [facility] office if she would like.   Thank you! Loel Dubonnet, NP

## 2019-12-21 ENCOUNTER — Other Ambulatory Visit: Payer: Self-pay | Admitting: *Deleted

## 2019-12-21 DIAGNOSIS — I471 Supraventricular tachycardia: Secondary | ICD-10-CM

## 2019-12-21 DIAGNOSIS — I491 Atrial premature depolarization: Secondary | ICD-10-CM

## 2019-12-21 MED ORDER — DILTIAZEM HCL ER COATED BEADS 180 MG PO CP24: 180.0000 mg | ORAL_CAPSULE | Freq: Every day | ORAL | 0 refills | Status: DC

## 2019-12-21 NOTE — Telephone Encounter (Signed)
Received faxed request from pharmacy for Diltiazem 180 mg once a day. Rx sent to pharmacy for #90 and 0 refills with notation for patient to call office to schedule follow up appointment for further refills.

## 2020-01-16 ENCOUNTER — Other Ambulatory Visit: Payer: Self-pay | Admitting: Cardiology

## 2020-01-16 DIAGNOSIS — I471 Supraventricular tachycardia: Secondary | ICD-10-CM

## 2020-01-16 DIAGNOSIS — I491 Atrial premature depolarization: Secondary | ICD-10-CM

## 2020-01-25 DIAGNOSIS — B3731 Acute candidiasis of vulva and vagina: Secondary | ICD-10-CM

## 2020-01-25 DIAGNOSIS — Z79890 Hormone replacement therapy (postmenopausal): Secondary | ICD-10-CM | POA: Insufficient documentation

## 2020-01-25 HISTORY — DX: Hormone replacement therapy: Z79.890

## 2020-01-25 HISTORY — DX: Acute candidiasis of vulva and vagina: B37.31

## 2020-04-09 DIAGNOSIS — M6701 Short Achilles tendon (acquired), right ankle: Secondary | ICD-10-CM

## 2020-04-09 HISTORY — DX: Short Achilles tendon (acquired), right ankle: M67.01

## 2020-04-13 ENCOUNTER — Other Ambulatory Visit: Payer: Self-pay | Admitting: Cardiology

## 2020-04-13 DIAGNOSIS — I491 Atrial premature depolarization: Secondary | ICD-10-CM

## 2020-04-13 DIAGNOSIS — I471 Supraventricular tachycardia: Secondary | ICD-10-CM

## 2020-04-13 MED ORDER — DILTIAZEM HCL ER COATED BEADS 180 MG PO CP24: 180.0000 mg | ORAL_CAPSULE | Freq: Every day | ORAL | 0 refills | Status: DC

## 2020-05-07 ENCOUNTER — Other Ambulatory Visit: Payer: Self-pay | Admitting: Cardiology

## 2020-05-07 DIAGNOSIS — I491 Atrial premature depolarization: Secondary | ICD-10-CM

## 2020-05-07 DIAGNOSIS — I471 Supraventricular tachycardia: Secondary | ICD-10-CM

## 2020-06-11 ENCOUNTER — Telehealth: Payer: Self-pay | Admitting: Cardiology

## 2020-06-11 DIAGNOSIS — I491 Atrial premature depolarization: Secondary | ICD-10-CM

## 2020-06-11 DIAGNOSIS — I4719 Other supraventricular tachycardia: Secondary | ICD-10-CM

## 2020-06-11 DIAGNOSIS — I471 Supraventricular tachycardia: Secondary | ICD-10-CM

## 2020-06-11 NOTE — Telephone Encounter (Signed)
Diltiazem sent to the pharmacy with instructions to arrange a follow up with Dr. Bettina Gavia for additional refills. 2 attempt.

## 2020-06-22 DIAGNOSIS — I471 Supraventricular tachycardia: Secondary | ICD-10-CM | POA: Insufficient documentation

## 2020-06-22 DIAGNOSIS — J449 Chronic obstructive pulmonary disease, unspecified: Secondary | ICD-10-CM | POA: Insufficient documentation

## 2020-06-22 DIAGNOSIS — E063 Autoimmune thyroiditis: Secondary | ICD-10-CM | POA: Insufficient documentation

## 2020-06-22 DIAGNOSIS — Z5189 Encounter for other specified aftercare: Secondary | ICD-10-CM | POA: Insufficient documentation

## 2020-06-22 DIAGNOSIS — D849 Immunodeficiency, unspecified: Secondary | ICD-10-CM | POA: Insufficient documentation

## 2020-06-22 DIAGNOSIS — G459 Transient cerebral ischemic attack, unspecified: Secondary | ICD-10-CM | POA: Insufficient documentation

## 2020-06-22 DIAGNOSIS — M1990 Unspecified osteoarthritis, unspecified site: Secondary | ICD-10-CM | POA: Insufficient documentation

## 2020-06-22 DIAGNOSIS — J189 Pneumonia, unspecified organism: Secondary | ICD-10-CM | POA: Insufficient documentation

## 2020-06-29 MED ORDER — DILTIAZEM HCL ER COATED BEADS 180 MG PO CP24: 180.0000 mg | ORAL_CAPSULE | Freq: Every day | ORAL | 0 refills | Status: DC

## 2020-06-29 NOTE — Telephone Encounter (Signed)
Pt called in and needs a week worth of this med . She rec'd 15 pills but that was not enough to make it to her appt .    Pharmacy - [facility]

## 2020-06-29 NOTE — Addendum Note (Signed)
Addended by: Resa Miner I on: 06/29/2020 10:32 AM   Modules accepted: Orders

## 2020-06-29 NOTE — Telephone Encounter (Signed)
Refill sent in per request.  

## 2020-07-05 DIAGNOSIS — D849 Immunodeficiency, unspecified: Secondary | ICD-10-CM | POA: Insufficient documentation

## 2020-07-05 DIAGNOSIS — U071 COVID-19: Secondary | ICD-10-CM

## 2020-07-05 HISTORY — DX: Immunodeficiency, unspecified: D84.9

## 2020-07-05 HISTORY — DX: COVID-19: U07.1

## 2020-07-05 NOTE — Progress Notes (Signed)
Cardiology Office Note:    Date:  07/06/2020   ID:  Kimberly Phillips, DOB 06-08-1946, MRN [id number]  PCP:  Basilio Cairo, MD  Cardiologist:  Shirlee More, MD    Referring MD: Sinclair Ship, MD    ASSESSMENT:    1. APC (atrial premature contractions)   2. PAT (paroxysmal atrial tachycardia) ([place])   3. Acquired hypothyroidism    PLAN:    In order of problems listed above:  1. Very nice response to combined calcium channel blocker beta-blocker resolution of her symptomatic atrial arrhythmia continue the same 2. Stable continue her current thyroid supplement   Next appointment: 12 months   Medication Adjustments/Labs and Tests Ordered: Current medicines are reviewed at length with the patient today.  Concerns regarding medicines are outlined above.  No orders of the defined types were placed in this encounter.  No orders of the defined types were placed in this encounter.   : Follow-up for atrial arrhythmia  History of Present Illness:    Kimberly Phillips is a 74 y.o. female with a hx of palpitation history of SVT/ectopic atrial tachycardia and symptomatic APCs,bronchiectasis and hypothyroidism.  She had an echocardiogram in April 2019 that showed normal left ventricular systolic function EF 87%.  She was last seen 09/16/2018. Compliance with diet, lifestyle and medications: Yes  She had COVID-19 and has had all 3 doses of mRNA vaccine  She is done exceptionally well adding beta-blocker and is having no arrhythmia.  No chest pain palpitation or syncope and her pulmonary disease is stable followed by pulmonology [facility].  She avoids over-the-counter proarrhythmic drugs.  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   Calcium Carb-Cholecalciferol (CALCIUM 600-D PO) Take 1,200 mg by mouth daily with lunch.   diltiazem (CARDIZEM CD) 180 MG 24 hr capsule Take 1 capsule (180 mg total) by mouth daily.   estradiol (ESTRACE) 0.5 MG tablet Take 0.5 mg by mouth daily with lunch.    levothyroxine (SYNTHROID, LEVOTHROID) 75 MCG tablet Take 1 tablet (75 mcg total) by mouth daily before breakfast. (Patient taking differently: Take 100 mcg by mouth daily before breakfast.)   magnesium oxide (MAG-OX) 400 MG tablet Take 400 mg by mouth at bedtime.   predniSONE (DELTASONE) 5 MG tablet Take 5 mg by mouth daily.   tiZANidine (ZANAFLEX) 4 MG tablet Take 1 tablet (4 mg total) by mouth at bedtime.   traZODone (DESYREL)  100 MG tablet Take 1 tablet by mouth every evening.     Allergies:   Codeine, Minocycline, Gamma globulin [immune globulin], Levofloxacin, Moxifloxacin, Piperacillin-tazobactam in dex, Ciprofloxacin, Liothyronine, Macrolides and ketolides, Oseltamivir, Tape, Telbivudine, Troleandomycin, Amoxicillin-pot clavulanate, Atenolol, Nitrofurantoin, and Sulfa antibiotics   Social History   Socioeconomic History    Marital status: Widowed    Spouse name: Not on file   Number of children: Not on file   Years of education: Not on file   Highest education level: Not on file  Occupational History   Not on file  Tobacco Use   Smoking status: Former Smoker   Smokeless tobacco: Never Used  Scientific laboratory technician Use: Never used  Substance and Sexual Activity   Alcohol use: No   Drug use: No   Sexual activity: Not Currently  Other Topics Concern   Not on file  Social History Narrative   Not on file   Social Determinants of Health   Financial Resource Strain: Not on file  Food Insecurity: Not on file  Transportation Needs: Not on file  Physical Activity: Not on file  Stress: Not on file  Social Connections: Not on file     Family History: The patient's family history includes CAD (age of onset: 22) in her father; Cancer in her mother; Diabetes in her maternal grandmother; Heart attack in her father. ROS:   Please see the history of present illness.    All other systems reviewed and are negative.  EKGs/Labs/Other Studies Reviewed:    The following studies were reviewed today:  EKG:  EKG ordered today and personally reviewed.  The ekg ordered today demonstrates sinus rhythm normal no APCs  Recent Labs: 03/02/2020: CBC normal hemoglobin 12.4 platelets 297,000 CMP potassium 4.0 creatinine 1.15 GFR 47 cc normal liver function test. Recent Lipid Panel    Component Value Date/Time   CHOL 146 10/30/2017 0318   TRIG 64 10/30/2017 0318   HDL 42 10/30/2017 0318   CHOLHDL 3.5 10/30/2017 0318   VLDL 13 10/30/2017 0318   LDLCALC 91 10/30/2017 0318    Physical Exam:    VS:  BP 110/64    Pulse 67    Ht 5\' 7"  (1.702 m)    Wt 136 lb (61.7 kg)    SpO2 97%    BMI 21.30 kg/m     Wt Readings from Last 3 Encounters:  07/06/20 136 lb (61.7 kg)  03/31/19 124 lb 6.4 oz (56.4 kg)  09/16/18 110 lb 12.8 oz (50.3 kg)     GEN:  Well nourished, well developed in no acute distress HEENT:  Normal NECK: No JVD; No carotid bruits LYMPHATICS: No lymphadenopathy CARDIAC: **RRR, no murmurs, rubs, gallops RESPIRATORY:  Clear to auscultation without rales, wheezing or rhonchi  ABDOMEN: Soft, non-tender, non-distended MUSCULOSKELETAL:  No edema; No deformity  SKIN: Warm and dry NEUROLOGIC:  Alert and oriented x 3 PSYCHIATRIC:  Normal affect    Signed, Shirlee More, MD  07/06/2020 1:39 PM    [facility]

## 2020-07-06 ENCOUNTER — Encounter: Payer: Self-pay | Admitting: Cardiology

## 2020-07-06 ENCOUNTER — Other Ambulatory Visit: Payer: Self-pay

## 2020-07-06 ENCOUNTER — Ambulatory Visit (INDEPENDENT_AMBULATORY_CARE_PROVIDER_SITE_OTHER): Payer: Medicare Other | Admitting: Cardiology

## 2020-07-06 VITALS — BP 110/64 | HR 67 | Ht 67.0 in | Wt 136.0 lb

## 2020-07-06 DIAGNOSIS — E039 Hypothyroidism, unspecified: Secondary | ICD-10-CM

## 2020-07-06 DIAGNOSIS — I471 Supraventricular tachycardia: Secondary | ICD-10-CM

## 2020-07-06 DIAGNOSIS — I491 Atrial premature depolarization: Secondary | ICD-10-CM | POA: Diagnosis not present

## 2020-07-06 MED ORDER — DILTIAZEM HCL ER COATED BEADS 180 MG PO CP24: 180.0000 mg | ORAL_CAPSULE | Freq: Every day | ORAL | 0 refills | Status: DC

## 2020-07-06 MED ORDER — ACEBUTOLOL HCL 200 MG PO CAPS: 200.0000 mg | ORAL_CAPSULE | Freq: Two times a day (BID) | ORAL | 3 refills | Status: DC

## 2020-07-06 NOTE — Patient Instructions (Signed)

## 2020-07-06 NOTE — Addendum Note (Signed)
Addended by: Resa Miner I on: 07/06/2020 01:42 PM   Modules accepted: Orders

## 2020-07-27 ENCOUNTER — Other Ambulatory Visit: Payer: Self-pay | Admitting: Cardiology

## 2020-07-27 DIAGNOSIS — I471 Supraventricular tachycardia: Secondary | ICD-10-CM

## 2020-07-27 DIAGNOSIS — I491 Atrial premature depolarization: Secondary | ICD-10-CM

## 2020-09-28 ENCOUNTER — Other Ambulatory Visit: Payer: Self-pay | Admitting: Cardiology

## 2020-09-28 DIAGNOSIS — I471 Supraventricular tachycardia: Secondary | ICD-10-CM

## 2020-09-28 DIAGNOSIS — I491 Atrial premature depolarization: Secondary | ICD-10-CM

## 2020-10-26 ENCOUNTER — Other Ambulatory Visit: Payer: Self-pay | Admitting: Cardiology

## 2020-10-26 DIAGNOSIS — I491 Atrial premature depolarization: Secondary | ICD-10-CM

## 2020-10-26 DIAGNOSIS — I471 Supraventricular tachycardia: Secondary | ICD-10-CM

## 2020-11-28 ENCOUNTER — Other Ambulatory Visit: Payer: Self-pay | Admitting: Cardiology

## 2020-11-28 DIAGNOSIS — I471 Supraventricular tachycardia: Secondary | ICD-10-CM

## 2020-11-28 DIAGNOSIS — I491 Atrial premature depolarization: Secondary | ICD-10-CM

## 2021-01-01 DIAGNOSIS — R252 Cramp and spasm: Secondary | ICD-10-CM

## 2021-01-01 DIAGNOSIS — Z7952 Long term (current) use of systemic steroids: Secondary | ICD-10-CM

## 2021-01-01 DIAGNOSIS — F5101 Primary insomnia: Secondary | ICD-10-CM

## 2021-01-01 HISTORY — DX: Long term (current) use of systemic steroids: Z79.52

## 2021-01-01 HISTORY — DX: Primary insomnia: F51.01

## 2021-01-01 HISTORY — DX: Cramp and spasm: R25.2

## 2021-01-02 DIAGNOSIS — N183 Chronic kidney disease, stage 3 unspecified: Secondary | ICD-10-CM | POA: Insufficient documentation

## 2021-01-02 HISTORY — DX: Chronic kidney disease, stage 3 unspecified: N18.30

## 2021-01-08 DIAGNOSIS — B449 Aspergillosis, unspecified: Secondary | ICD-10-CM | POA: Insufficient documentation

## 2021-01-08 HISTORY — DX: Aspergillosis, unspecified: B44.9

## 2021-02-20 DIAGNOSIS — M8580 Other specified disorders of bone density and structure, unspecified site: Secondary | ICD-10-CM | POA: Insufficient documentation

## 2021-02-20 HISTORY — DX: Other specified disorders of bone density and structure, unspecified site: M85.80

## 2021-02-27 ENCOUNTER — Other Ambulatory Visit: Payer: Self-pay | Admitting: Cardiology

## 2021-02-27 DIAGNOSIS — I491 Atrial premature depolarization: Secondary | ICD-10-CM

## 2021-02-27 DIAGNOSIS — I471 Supraventricular tachycardia: Secondary | ICD-10-CM

## 2021-04-01 ENCOUNTER — Telehealth: Payer: Self-pay | Admitting: Cardiology

## 2021-04-01 DIAGNOSIS — R002 Palpitations: Secondary | ICD-10-CM

## 2021-04-01 NOTE — Telephone Encounter (Signed)
Patient c/o Palpitations:  High priority if patient c/o lightheadedness, shortness of breath, or chest pain  How long have you had palpitations/irregular HR/ Afib? Are you having the symptoms now?  Patient states she has been having palpitations month and she is having palpitations right now   Are you currently experiencing lightheadedness, SOB or CP?  Not currently, but patient states she has had some lightheadedness and SOB recently  Do you have a history of afib (atrial fibrillation) or irregular heart rhythm?  Yes   Have you checked your BP or HR? (document readings if available):  No   Are you experiencing any other symptoms?  Sweats, weakness

## 2021-04-02 NOTE — Addendum Note (Signed)
Addended by: Resa Miner I on: 04/02/2021 09:20 AM   Modules accepted: Orders

## 2021-04-02 NOTE — Telephone Encounter (Signed)
Spoke to the patient just now. She is going to come in on Thursday to get her monitor placed as today and tomorrow did not work for her. She thanked me for calling her back and was agreeable to our plan of care.

## 2021-04-02 NOTE — Telephone Encounter (Signed)
Spoke to the patient just now and she let me know that she has been having palpitations that start around lunch time every day and will last until she goes to bed at night. This has been going on for about two weeks. She tells me that she has been very sick with pneumonia and is very weak, lightheaded, and SOB. She is assuming that these symptoms were from her being sick with the pneumonia for 3 months but she wants to make sure that the palpitations are not a concern. She has not taken her blood pressure or heart rate recently and can not take it for me a this time.

## 2021-04-04 ENCOUNTER — Ambulatory Visit: Payer: Medicare Other

## 2021-04-04 ENCOUNTER — Ambulatory Visit (INDEPENDENT_AMBULATORY_CARE_PROVIDER_SITE_OTHER): Payer: Medicare Other

## 2021-04-04 ENCOUNTER — Other Ambulatory Visit: Payer: Self-pay

## 2021-04-04 DIAGNOSIS — R002 Palpitations: Secondary | ICD-10-CM

## 2021-04-29 ENCOUNTER — Telehealth: Payer: Self-pay | Admitting: Cardiology

## 2021-04-29 MED ORDER — DILTIAZEM HCL 30 MG PO TABS: 30.0000 mg | ORAL_TABLET | Freq: Three times a day (TID) | ORAL | 3 refills | Status: DC

## 2021-04-29 NOTE — Telephone Encounter (Signed)
Pt is following up on her Heart Monitor results. Please advise pt further

## 2021-04-29 NOTE — Telephone Encounter (Signed)
Spoke with patient regarding results and recommendation.  Patient verbalizes understanding and is agreeable to plan of care. Advised patient to call back with any issues or concerns.    She tells me that her heart mostly bothers her at night and she can feel it beating from her head to feet. She had already taken all of her medications. She was unable to take her vital signs at this time. She tells me that this happens about every other night and lasts for about 30 minutes-hour depending on how much stress she is under. She states that "This has to stop. I can not keep going like this".  I will route to Dr. Bettina Gavia for his recommendations from here.

## 2021-04-29 NOTE — Addendum Note (Signed)
Addended by: Resa Miner I on: 04/29/2021 02:53 PM   Modules accepted: Orders

## 2021-04-29 NOTE — Telephone Encounter (Signed)
Spoke with the patient just now and let her know Dr. Joya Gaskins recommendations. I clarified with Dr. Bettina Gavia his instructions for the cardizem as she was already taking 180 mg daily. He wants her to take 30 mg TID of the short acting cardizem. This was called in for her.

## 2021-05-02 ENCOUNTER — Ambulatory Visit (INDEPENDENT_AMBULATORY_CARE_PROVIDER_SITE_OTHER): Payer: Medicare Other | Admitting: Cardiology

## 2021-05-02 ENCOUNTER — Other Ambulatory Visit: Payer: Self-pay

## 2021-05-02 ENCOUNTER — Encounter: Payer: Self-pay | Admitting: Cardiology

## 2021-05-02 VITALS — BP 140/72 | HR 68 | Ht 67.0 in | Wt 150.0 lb

## 2021-05-02 DIAGNOSIS — R002 Palpitations: Secondary | ICD-10-CM | POA: Diagnosis not present

## 2021-05-02 DIAGNOSIS — I491 Atrial premature depolarization: Secondary | ICD-10-CM | POA: Diagnosis not present

## 2021-05-02 DIAGNOSIS — J449 Chronic obstructive pulmonary disease, unspecified: Secondary | ICD-10-CM | POA: Diagnosis not present

## 2021-05-02 DIAGNOSIS — I471 Supraventricular tachycardia: Secondary | ICD-10-CM

## 2021-05-02 NOTE — Progress Notes (Signed)
Cardiology Office Note:    Date:  05/02/2021   ID:  Kimberly Phillips, DOB 05-19-1946, MRN [id number]  PCP:  Lilian Coma., MD  Cardiologist:  Shirlee More, MD    Referring MD: Lilian Coma., MD    ASSESSMENT:    1. APC (atrial premature contractions)   2. PAT (paroxysmal atrial tachycardia) (HCC)   3. Palpitation   4. Chronic obstructive pulmonary disease, unspecified COPD type ([place])    PLAN:    In order of problems listed above:  Her atrial arrhythmia was markedly worsened by her pulmonary disease and she has now improved by switching from long-acting to short-acting calcium channel blocker on top of her beta-blocker.  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 Severe underlying lung disease with acute exacerbation recently improved   Next appointment: 6 months   Medication Adjustments/Labs and Tests Ordered: Current medicines are reviewed at length with the patient today.  Concerns regarding medicines are outlined above.  No orders of the defined types were placed in this encounter.  No orders of the defined types were placed in this encounter.   Chief Complaint  Patient presents with   Annual Exam    History of Present Illness:    Kimberly Phillips is a 75 y.o. female with a hx of SVT ectopic atrial tachycardia symptomatic APCs bronchiectasis and hypothyroidism last seen 07/06/2020.She had an echocardiogram in April 2019 that showed normal left ventricular systolic function EF 56%.  Recently contacted my office with worsening palpitation rapid heart rhythm and utilize another extended 7-day event monitor reported 04/29/2021 showing frequent episodes of runs of APCs brief atrial tachycardia longest episode 13 seconds.  Compliance with diet, lifestyle and medications: Yes  Study Highlights Patch Wear Time:  6 days and 13 hours (2022-09-15T10:39:39-0400 to  2022-09-22T00:28:35-0400) Patient had a min HR of 49 bpm, max HR of 193 bpm, and avg HR of 66 bpm. Predominant underlying rhythm was Sinus Rhythm. 687 Supraventricular Tachycardia runs occurred, the run with the fastest interval lasting 5 beats with a max rate of 193 bpm, the  longest lasting 12.8 secs with an avg rate of 109 bpm. Supraventricular Tachycardia was detected within +/- 45 seconds of symptomatic patient event(s). Isolated SVEs were occasional (2.7%, 14838), SVE Couplets were rare (<1.0%, 1695), and SVE Triplets  were occasional (1.0%, 1874). Isolated VEs were rare (<1.0%), and no VE Couplets or VE Triplets were present.    30 triggered and 6 diary events typically associated APCs and brief runs of atrial premature contractions. 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 We transition from long-acting to short acting carvedilol and within 3 doses for palpitation is markedly improved. If recurrent symptomatic atrial tachycardia we can place her on low-dose flecainide. He finds her self weak but she is improving is returned to work she has exertional shortness of breath presently not wheezing no edema orthopnea or syncope. 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   Calcium Carb-Cholecalciferol (CALCIUM 600-D PO) Take 1,200 mg by mouth daily with lunch.   diltiazem (CARDIZEM) 30 MG tablet Take 1 tablet (30 mg total) by mouth 3 (three) times daily.   estradiol (ESTRACE) 0.5 MG tablet Take 0.5 mg by mouth daily with lunch.    levothyroxine (SYNTHROID) 100 MCG tablet Take 100 mcg by mouth every morning.   magnesium oxide (MAG-OX) 400 MG tablet Take 400 mg by mouth at bedtime.   predniSONE (DELTASONE) 5 MG tablet Take 5 mg by mouth daily.   tiZANidine (ZANAFLEX) 4 MG tablet Take 1 tablet (4 mg total) by mouth at bedtime.   traZODone (DESYREL) 100 MG tablet Take 1 tablet by mouth every evening.   ZINC SULFATE PO Take 1 tablet by mouth daily.     Allergies:   Codeine, Minocycline, Gamma globulin [immune globulin], Levofloxacin, Moxifloxacin, Piperacillin-tazobactam in dex, Ciprofloxacin, Liothyronine, Macrolides and ketolides, Oseltamivir, Tape, Telbivudine, Troleandomycin, Amoxicillin-pot clavulanate, Atenolol, Nitrofurantoin, and Sulfa antibiotics   Social History   Socioeconomic History   Marital status: Widowed    Spouse name: Not on file   Number of children: Not on file   Years of education: Not on file   Highest education level: Not on file  Occupational History   Not on file  Tobacco Use   Smoking status: Former   Smokeless tobacco: Never  Vaping Use   Vaping Use: Never used  Substance and Sexual Activity   Alcohol use: No   Drug use: No   Sexual activity: Not Currently  Other Topics Concern   Not on file  Social History Narrative   Not on file   Social  Determinants of Health   Financial Resource Strain: Not on file  Food Insecurity: Not on file  Transportation Needs: Not on file  Physical Activity: Not on file  Stress: Not on file  Social Connections: Not on file     Family History: The patient's family history includes CAD (age of onset: 22) in her father; Cancer in her mother; Diabetes in her maternal grandmother; Heart attack in her father. ROS:   Please see the history of present illness.    All other systems reviewed and are negative.  EKGs/Labs/Other Studies Reviewed:    The following studies were reviewed today:    Recent Labs: No results found for requested labs within last 8760 hours.  Recent Lipid Panel    Component Value Date/Time   CHOL 146 10/30/2017 0318   TRIG 64 10/30/2017 0318   HDL 42 10/30/2017 0318   CHOLHDL 3.5 10/30/2017 0318   VLDL 13 10/30/2017 0318   LDLCALC 91 10/30/2017 0318    Physical Exam:    VS:  BP 140/72 (BP Location: Right Arm, Patient Position: Sitting, Cuff Size: Normal)   Pulse 68   Ht 5\' 7"  (1.702 m)  Wt 150 lb (68 kg)   SpO2 96%   BMI 23.49 kg/m     Wt Readings from Last 3 Encounters:  05/02/21 150 lb (68 kg)  07/06/20 136 lb (61.7 kg)  03/31/19 124 lb 6.4 oz (56.4 kg)     GEN:  Well nourished, well developed in no acute distress HEENT: Normal NECK: No JVD; No carotid bruits LYMPHATICS: No lymphadenopathy CARDIAC: RRR, no murmurs, rubs, gallops RESPIRATORY:  Clear to auscultation without rales, wheezing or rhonchi  ABDOMEN: Soft, non-tender, non-distended MUSCULOSKELETAL:  No edema; No deformity  SKIN: Warm and dry NEUROLOGIC:  Alert and oriented x 3 PSYCHIATRIC:  Normal affect    Signed, Shirlee More, MD  05/02/2021 4:22 PM    [facility]

## 2021-05-02 NOTE — Patient Instructions (Signed)

## 2021-07-03 ENCOUNTER — Ambulatory Visit: Payer: Medicare Other | Admitting: Cardiology

## 2021-07-03 ENCOUNTER — Ambulatory Visit (INDEPENDENT_AMBULATORY_CARE_PROVIDER_SITE_OTHER): Payer: Medicare Other | Admitting: Cardiology

## 2021-07-03 ENCOUNTER — Encounter: Payer: Self-pay | Admitting: Cardiology

## 2021-07-03 ENCOUNTER — Other Ambulatory Visit: Payer: Self-pay

## 2021-07-03 VITALS — BP 112/76 | HR 69 | Ht 67.0 in | Wt 145.1 lb

## 2021-07-03 DIAGNOSIS — I491 Atrial premature depolarization: Secondary | ICD-10-CM | POA: Diagnosis not present

## 2021-07-03 DIAGNOSIS — J449 Chronic obstructive pulmonary disease, unspecified: Secondary | ICD-10-CM

## 2021-07-03 DIAGNOSIS — I471 Supraventricular tachycardia: Secondary | ICD-10-CM

## 2021-07-03 NOTE — Patient Instructions (Signed)
Medication Instructions:  Your physician recommends that you continue on your current medications as directed. Please refer to the Current Medication list given to you today.  You may take an extra dose of diltiazem at night as needed.  *If you need a refill on your cardiac medications before your next appointment, please call your pharmacy*   Lab Work: None If you have labs (blood work) drawn today and your tests are completely normal, you will receive your results only by: [place] (if you have MyChart) OR A paper copy in the mail If you have any lab test that is abnormal or we need to change your treatment, we will call you to review the results.   Testing/Procedures: None   Follow-Up: At [facility], you and your health needs are our priority.  As part of our continuing mission to provide you with exceptional heart care, we have created designated Provider Care Teams.  These Care Teams include your primary Cardiologist (physician) and Advanced Practice Providers (APPs -  Physician Assistants and Nurse Practitioners) who all work together to provide you with the care you need, when you need it.  We recommend signing up for the patient portal called "MyChart".  Sign up information is provided on this After Visit Summary.  MyChart is used to connect with patients for Virtual Visits (Telemedicine).  Patients are able to view lab/test results, encounter notes, upcoming appointments, etc.  Non-urgent messages can be sent to your provider as well.   To learn more about what you can do with MyChart, go to [email or web address].    Your next appointment:   6 month(s)  The format for your next appointment:   In Person  Provider:   Shirlee More, MD    Other Instructions

## 2021-07-03 NOTE — Progress Notes (Signed)
Cardiology Office Note:    Date:  07/03/2021   ID:  Kimberly Phillips, DOB 1945-11-16, MRN [id number]  PCP:  Lilian Coma., MD  Cardiologist:  Shirlee More, MD    Referring MD: Lilian Coma., MD    ASSESSMENT:    1. APC (atrial premature contractions)   2. PAT (paroxysmal atrial tachycardia) ([place])   3. Chronic obstructive pulmonary disease, unspecified COPD type ([place])    PLAN:    In order of problems listed above:  She is doing much better with her atrial arrhythmia with a combination of low-dose selective beta-blocker calcium channel blocker and can take an extra dose of diltiazem as needed Stable improved COPD   Next appointment: 6 months   Medication Adjustments/Labs and Tests Ordered: Current medicines are reviewed at length with the patient today.  Concerns regarding medicines are outlined above.  No orders of the defined types were placed in this encounter.  No orders of the defined types were placed in this encounter.   Chief complaint: Follow-up for atrial arrhythmia   History of Present Illness:    Kimberly Phillips is a 75 y.o. female with a hx of APCs ectopic atrial tachycardia SVT bronchiectasis and hypothyroidism last seen 05/02/2021. Compliance with diet, lifestyle and medications: Yes  She continues to do well she only has palpitation and emotional situations not frequent or sustained and I told her as needed she can take an extra dose of diltiazem Overall strength and endurance are better stable lung disease No chest pain edema or syncope. 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   Calcium Carb-Cholecalciferol (CALCIUM 600-D PO) Take 1,200 mg by mouth daily with lunch.   diltiazem (CARDIZEM) 30 MG tablet Take 1 tablet (30 mg total) by mouth 3 (three) times daily.   estradiol (ESTRACE) 0.5 MG tablet Take 0.5 mg by mouth daily with lunch.    levothyroxine (SYNTHROID) 100 MCG tablet Take 100 mcg by mouth every morning.   magnesium oxide (MAG-OX) 400 MG tablet Take 400 mg by mouth at bedtime.   predniSONE (DELTASONE) 5 MG tablet Take 5 mg by mouth daily.   tiZANidine (ZANAFLEX) 4 MG tablet Take 1 tablet (4 mg total) by mouth at bedtime.   traZODone (DESYREL) 100 MG tablet Take 1 tablet by mouth every evening.   ZINC SULFATE PO Take 1 tablet by mouth daily.     Allergies:   Codeine, Minocycline, Dermatitis antigen, Gamma globulin [immune globulin],  Levofloxacin, Macrolides and ketolides, Moxifloxacin, Piperacillin-tazobactam in dex, Ciprofloxacin, Liothyronine, Oseltamivir, Tape, Telbivudine, Troleandomycin, Amoxicillin-pot clavulanate, Atenolol, Nitrofurantoin, and Sulfa antibiotics   Social History   Socioeconomic History   Marital status: Widowed    Spouse name: Not on file   Number of children: Not on file   Years of education: Not on file   Highest education level: Not on file  Occupational History   Not on file  Tobacco Use   Smoking status: Former    Smokeless tobacco: Never  Vaping Use   Vaping Use: Never used  Substance and Sexual Activity   Alcohol use: No   Drug use: No   Sexual activity: Not Currently  Other Topics Concern   Not on file  Social History Narrative   Not on file   Social Determinants of Health   Financial Resource Strain: Not on file  Food Insecurity: Not on file  Transportation Needs: Not on file  Physical Activity: Not on file  Stress: Not on file  Social Connections: Not on file     Family History: The patient's family history includes CAD (age of onset: 66) in her father; Cancer in her mother; Diabetes in her maternal grandmother; Heart attack in her father. ROS:   Please see the history of present illness.    All other systems reviewed and are negative.  EKGs/Labs/Other Studies Reviewed:    The following studies were reviewed today:  EKG:  EKG ordered today and personally reviewed.  The ekg ordered today demonstrates sinus rhythm normal EKG  Recent Labs: No results found for requested labs within last 8760 hours.  Recent Lipid Panel    Component Value Date/Time   CHOL 146 10/30/2017 0318   TRIG 64 10/30/2017 0318   HDL 42 10/30/2017 0318   CHOLHDL 3.5 10/30/2017 0318   VLDL 13 10/30/2017 0318   LDLCALC 91 10/30/2017 0318    Physical Exam:    VS:  BP 112/76    Pulse 69    Ht 5\' 7"  (1.702 m)    Wt 145 lb 1.3 oz (65.8 kg)    SpO2 95%    BMI 22.72 kg/m     Wt Readings from Last 3 Encounters:  07/03/21 145 lb 1.3 oz (65.8 kg)  05/02/21 150 lb (68 kg)  07/06/20 136 lb (61.7 kg)     GEN: She looks stronger well nourished, well developed in no acute distress HEENT: Normal NECK: No JVD; No carotid bruits LYMPHATICS: No lymphadenopathy CARDIAC: RRR, no murmurs, rubs, gallops RESPIRATORY:  Clear to auscultation without rales, wheezing or rhonchi  ABDOMEN: Soft, non-tender, non-distended MUSCULOSKELETAL:  No edema; No deformity  SKIN: Warm and dry NEUROLOGIC:  Alert and oriented x  3 PSYCHIATRIC:  Normal affect    Signed, Shirlee More, MD  07/03/2021 1:54 PM    [facility]

## 2021-07-30 ENCOUNTER — Other Ambulatory Visit: Payer: Self-pay | Admitting: Cardiology

## 2021-08-15 ENCOUNTER — Ambulatory Visit: Payer: Medicare Other | Admitting: Cardiology

## 2021-10-09 ENCOUNTER — Telehealth: Payer: Self-pay | Admitting: Cardiology

## 2021-10-09 NOTE — Telephone Encounter (Signed)
Reports more frequent palpitations. BP checked this morning 98/60 & HR wasn't checked. Reports chest pain with palpitations this morning rated 8/10, took (3) aspirin 81 mg and says symptoms resolved. Reports worsening SOB & dizziness with palpitations and chest pain this morning. Medications reviewed. Advised with symptoms reported, she needed to go to the ED for an evaluation. Says she won't go. Offered appointment in the morning '@10'$ :20 am with Dr. Bettina Gavia but declined, stating she had to be at work tomorrow. Offered first available after tomorrow (11/11/21) and says she can't wait that long. Advised message would be sent to provider. Verbalized understanding.  ?

## 2021-10-09 NOTE — Telephone Encounter (Signed)
.  Patient c/o Palpitations:  High priority if patient c/o lightheadedness, shortness of breath, or chest pain ? ?How long have you had palpitations/irregular HR/ Afib? Are you having the symptoms now? Not at ghis time- was really bad last night ? ?Are you currently experiencing lightheadedness, SOB or CP? All of these when she have the palpitations ? ?Do you have a history of afib (atrial fibrillation) or irregular heart rhythm?  ? ?Have you checked your BP or HR? (document readings if available): she did not know ? ?Are you experiencing any other symptoms? No- patient wanted to be seen- can not come tomorrow , she is working ? ?

## 2021-11-11 DIAGNOSIS — E870 Hyperosmolality and hypernatremia: Secondary | ICD-10-CM

## 2021-11-11 HISTORY — DX: Hyperosmolality and hypernatremia: E87.0

## 2021-12-13 DIAGNOSIS — D692 Other nonthrombocytopenic purpura: Secondary | ICD-10-CM | POA: Insufficient documentation

## 2021-12-18 NOTE — Progress Notes (Unsigned)
Cardiology Office Note:    Date:  12/19/2021   ID:  Kimberly Phillips, DOB 03-18-1946, MRN [id number]  PCP:  Lilian Coma., MD  Cardiologist:  Shirlee More, MD    Referring MD: Lilian Coma., MD    ASSESSMENT:    1. PAT (paroxysmal atrial tachycardia) ([place])   2. APC (atrial premature contractions)   3. Junctional tachycardia ([place])   4. Myopathy   5. 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  Were going to go ahead and increase her beta-blocker to 3 times a day along with a calcium channel blocker 3 times a day and trend her blood pressures at home.  Her systolic is relatively low and I asked her to add a salt tablet to her diet daily.  She does not want to do a monitor at this time and asked her to activate the smart watch heart rate limits and EKG capability and I will do an EKG prior to leaving the office.  She will also continue magnesium supplementation She has obvious myopathy had trouble standing in the office from a chair especially lower extremities and proximal I think she would benefit from physical therapy Is actually improved stable continue current treatment Stable recently increased dose of thyroid supplement through her PCP   Next appointment: 3 months   Medication Adjustments/Labs and Tests Ordered: Current medicines are reviewed at length with the patient today.  Concerns regarding medicines are outlined above.  No orders of the defined types were placed in this encounter.  No orders of the defined types were placed in this encounter.   Chief Complaint  Patient presents with   Follow-up    For atrial arrhythmia    History of Present Illness:    Kimberly Phillips is a 76 y.o. female with a hx of APCs  ectopic atrial tachycardia bronchiectasis and hypothyroidism last seen 07/03/2021. Her echocardiogram performed 10/30/2017 showed normal left ventricular size wall thickness systolic function and diastolic filling pressures.  Both atria were normal in size right ventricle normal size and function and she had mild aortic regurgitation   Compliance with diet, lifestyle and medications: Yes  Recently she has struggled she has a great deal of proximal and lower extremity muscle weakness had requested to go to physical therapy went there and had trouble ambulating her heart was racing she was short of breath and was told by physical therapy to see cardiology She has a smart watch but has not activated the EKG of the heart rate limits on it. She takes Zanaflex which can have a strong alpha blockade effect and I asked her to stop Her blood pressure is relatively low 106/60 sitting and standing I asked her to add a salt tablet to her diet and also trend her blood pressure at home To blunt her exercise heart rates were going to increase her beta-blocker to 3 times a day she does not want to do an event monitor at this time. She is doing better with her COPD is not limited by shortness of breath no edema or chest pain she has not lost consciousness She does have lightheadedness when she shifts posture She also had COVID-19 infection that can cause dysautonomia  Recent labs 12/13/2021: Hemoglobin  mildly diminished 11.2 normocytic Serum sodium 144 creatinine 1.29 GFR 43 cc TSH 1.88 Past Medical History:  Diagnosis Date   Abdominal bloating 03/20/2016   Abnormal chest CT 02/03/2012   Acute asthmatic bronchitis 11/22/2014   Acute bronchitis due to other specified organisms 08/20/2016   Acute URI 03/20/2016   Adjustment disorder 12/21/2013   Allergic asthma with acute exacerbation 06/05/2014   Alopecia 12/21/2013   ANA positive 06/29/2017   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 Followed by Cardiology. Formatting of this note might be different from the original. Followed by Cardiology.  Declines anticoagulation.   Attention deficit disorder 12/21/2013   Formatting of this note might be different from the original. IMO routine update Formatting of this note might be different from the original. Overview:  IMO routine update   Benzodiazepine dependence ([place]) 10/21/2018   Bilateral acute serous otitis media 08/11/2016   Blood transfusion without reported diagnosis    Bronchiectasis ([place]) 09/16/2018   Calcification of lung 12/21/2013   Candidal vulvovaginitis 01/25/2020   Carbuncle of finger of right hand 09/26/2014   Chest pain 10/29/2017   Chondromalacia patellae 12/21/2013   Chronic cerebral ischemia 12/21/2013   Chronic headache 12/21/2013   CKD (chronic kidney disease) stage 3, GFR 30-59 ml/min ([place]) 01/02/2021   Colitis 03/20/2016   Community acquired pneumonia 02/28/2015   Contusion of left knee 08/28/2014   COPD (chronic obstructive pulmonary disease) ([place])    Costochondritis 08/20/2016   COVID-19 in immunocompromised patient ([place]) 07/05/2020   Current long-term use of postmenopausal hormone replacement therapy 01/25/2020   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 No Intervention needed- chronic stable problem; intolerant of IVIG in past per EMR - follows pulm (Dr Audery Amel) and saw Rheum in august 2019 (Dr Abner Greenspan with [facility]) Formatting of this note might be different from the original. Patient declines IVIG treatment due to expense   Excessive daytime sleepiness 12/03/2015   Fatigue 10/30/2017   Gastroesophageal reflux disease  without esophagitis 12/21/2013   Last Assessment & Plan:  Formatting of this note might be different from the original. 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 IMPRESSION: acephalgic migraine   Intrinsic asthma 03/13/2017   Iris nevus, right 11/12/2016   Iron deficiency anemia 11/29/2014   Irritable bowel syndrome 12/21/2013   Junctional tachycardia ([place]) 07/20/2018   Left ventricular dysfunction 12/21/2013   Leg cramps  10/12/2017   Leg edema, left 02/02/2014   Malaise and fatigue 12/21/2013   Malignant melanoma of torso excluding breast ([place]) 03/25/2017   Memory loss 09/08/2012   Migraine with aura and without status migrainosus, not intractable 10/12/2017   Mild persistent asthma without complication 2/95/1884   Mitral valve prolapse 09/08/2012   Morton's neuroma 12/21/2013   Mounier-Kuhn bronchiectasis with acute exacerbation ([place]) 09/16/2018   Multiple pulmonary nodules determined by computed tomography of lung 12/27/2013   Muscle cramps 01/01/2021   Muscle strain of upper extremity 02/28/2015   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 Chronic oral corticosteroids prescribed by pulmonology for lung disease.   Orthostatic hypotension 12/21/2013   Osteoarthritis, localized, knee 12/21/2013   Osteopenia 02/20/2021   Other nonrheumatic tricuspid valve disorders 12/21/2013   Pain of right lower extremity 01/02/2017   Formatting of this note might be different from the original. Possibly due to electrolyte imbalance vs. Venous insufficiency, patient stands all day at work  - will trial vasculear - check mag and phos - recheck TSH while here   Palpitation 07/20/2018   Paroxysmal A-fib ([place]) 12/29/2013   Formatting of this note might be different from the original. Overview:  Last Assessment & Plan:  Followed by Cardiology.  Last Assessment & Plan:  Followed by Cardiology.  Overview:  Last Assessment & Plan:  Followed by Cardiology.  Last Assessment & Plan:  Formatting of this note might be different from the original. - CHADS2Vasc 3 (female and hx TIA 2018), on ASA only at this time and diltiazem   PAT (paroxysmal atrial tachycardia) ([place]) 09/16/2018   Pernicious anemia 12/29/2013   Phlebitis and thrombophlebitis of superficial vessels of left lower extremity 06/30/2014   Plantar fasciitis, left 02/14/2014   Pleural effusion 12/21/2013   Pleuritis 12/26/2015   Pneumonia    Posterior vitreous detachment, left eye 11/12/2016   Prepatellar bursitis of both knees 07/18/2015   Primary insomnia 01/01/2021   Primary localized osteoarthrosis of ankle and foot 02/14/2014   Restrictive lung disease 12/21/2013   S/P hysterectomy with oophorectomy 11/01/2014   Sepsis due to urinary tract infection ([place]) 11/30/2013   Formatting of this note might be different from the original. IMO routine update   Small intestinal bacterial overgrowth 06/08/2017   Small vessel disease, cerebrovascular 12/21/2013   SOB (shortness of breath) 02/03/2012   Superficial phlebitis 03/20/2016   SVT  (supraventricular tachycardia) ([place])    Symptomatic menopausal or female climacteric states 12/21/2013   Tachypnea 08/15/2014   TIA (transient ischemic attack)    Tobacco use disorder 12/21/2013   Transient alteration of awareness 12/21/2013   Transient cerebral ischemia 09/08/2012   Traumatic hematoma of left lower leg 03/20/2016   Trochanteric bursitis 12/10/2016   Varicose veins of both lower extremities 01/02/2017   Formatting of this note might be different from the original. Overview:  Chronic, severe, up to hips - will start vasculera as this could be a cause of pain - samples given today - will send to specialty pharmacy - patient cannot tolerate compression stockings Formatting of this note might be different from the original. Overview:  Chronic, severe, up to hips - will start vasculera as this could b   Vertigo 02/14/2015   Vitreous floater, bilateral 11/12/2016   Weight loss 03/28/2015    Past  Surgical History:  Procedure Laterality Date   ABDOMINAL HYSTERECTOMY     APPENDECTOMY     CATARACT EXTRACTION Bilateral    CHOLECYSTECTOMY     TONSILLECTOMY      Current Medications: Current Meds  Medication Sig   acebutolol (SECTRAL) 200 MG capsule TAKE 1 CAPSULE BY MOUTH 2 TIMES DAILY   Calcium Carb-Cholecalciferol (CALCIUM 600-D PO) Take 1,200 mg by mouth daily with lunch.   diltiazem (CARDIZEM) 30 MG tablet Take 1 tablet (30 mg total) by mouth 3 (three) times daily.   estradiol (ESTRACE) 0.5 MG tablet Take 0.5 mg by mouth daily with lunch.    magnesium oxide (MAG-OX) 400 MG tablet Take 400 mg by mouth at bedtime.   predniSONE (DELTASONE) 5 MG tablet Take 5 mg by mouth daily.   tiZANidine (ZANAFLEX) 4 MG tablet Take 1 tablet (4 mg total) by mouth at bedtime.   traZODone (DESYREL) 100 MG tablet Take 1 tablet by mouth every evening.   ZINC SULFATE PO Take 1 tablet by mouth daily.   [DISCONTINUED] levothyroxine (SYNTHROID) 100 MCG tablet Take 100 mcg by mouth every morning.     Allergies:    Codeine, Minocycline, Dermatitis antigen, Gamma globulin [immune globulin], Levofloxacin, Macrolides and ketolides, Moxifloxacin, Oseltamivir, Petrolatum-zinc oxide, Piperacillin-tazobactam in dex, Ciprofloxacin, Liothyronine, Tape, Telbivudine, Troleandomycin, Amoxicillin-pot clavulanate, Atenolol, Nitrofurantoin, and Sulfa antibiotics   Social History   Socioeconomic History   Marital status: Widowed    Spouse name: Not on file   Number of children: Not on file   Years of education: Not on file   Highest education level: Not on file  Occupational History   Not on file  Tobacco Use   Smoking status: Former   Smokeless tobacco: Never  Vaping Use   Vaping Use: Never used  Substance and Sexual Activity   Alcohol use: No   Drug use: No   Sexual activity: Not Currently  Other Topics Concern   Not on file  Social History Narrative   Not on file   Social Determinants of Health   Financial Resource Strain: Not on file  Food Insecurity: Not on file  Transportation Needs: Not on file  Physical Activity: Not on file  Stress: Not on file  Social Connections: Not on file     Family History: The patient's family history includes CAD (age of onset: 100) in her father; Cancer in her mother; Diabetes in her maternal grandmother; Heart attack in her father. ROS:   Please see the history of present illness.    All other systems reviewed and are negative.  EKGs/Labs/Other Studies Reviewed:    The following studies were reviewed today:  EKG:  EKG ordered today and personally reviewed.  The ekg ordered today demonstrates sinus rhythm artifact otherwise normal EKG  Recent Labs: No results found for requested labs within last 8760 hours.  Recent Lipid Panel    Component Value Date/Time   CHOL 146 10/30/2017 0318   TRIG 64 10/30/2017 0318   HDL 42 10/30/2017 0318   CHOLHDL 3.5 10/30/2017 0318   VLDL 13 10/30/2017 0318   LDLCALC 91 10/30/2017 0318    Physical Exam:    VS:  BP  118/60   Pulse 76   Ht '5\' 7"'$  (1.702 m)   Wt 147 lb 1.3 oz (66.7 kg)   SpO2 95%   BMI 23.04 kg/m     Wt Readings from Last 3 Encounters:  12/19/21 147 lb 1.3 oz (66.7 kg)  07/03/21 145 lb 1.3  oz (65.8 kg)  05/02/21 150 lb (68 kg)     GEN: She looks better than previous visits well nourished, well developed in no acute distress HEENT: Normal NECK: No JVD; No carotid bruits LYMPHATICS: No lymphadenopathy CARDIAC: RRR, no murmurs, rubs, gallops RESPIRATORY:  Clear to auscultation without rales, wheezing or rhonchi  ABDOMEN: Soft, non-tender, non-distended MUSCULOSKELETAL:  No edema; No deformity  SKIN: Warm and dry NEUROLOGIC:  Alert and oriented x 3 PSYCHIATRIC:  Normal affect    Signed, Shirlee More, MD  12/19/2021 1:33 PM    [facility]

## 2021-12-19 ENCOUNTER — Encounter: Payer: Self-pay | Admitting: Cardiology

## 2021-12-19 ENCOUNTER — Ambulatory Visit (INDEPENDENT_AMBULATORY_CARE_PROVIDER_SITE_OTHER): Payer: Medicare Other | Admitting: Cardiology

## 2021-12-19 VITALS — BP 118/60 | HR 76 | Ht 67.0 in | Wt 147.1 lb

## 2021-12-19 DIAGNOSIS — I491 Atrial premature depolarization: Secondary | ICD-10-CM | POA: Diagnosis not present

## 2021-12-19 DIAGNOSIS — G729 Myopathy, unspecified: Secondary | ICD-10-CM

## 2021-12-19 DIAGNOSIS — I4719 Other supraventricular tachycardia: Secondary | ICD-10-CM

## 2021-12-19 DIAGNOSIS — I471 Supraventricular tachycardia: Secondary | ICD-10-CM

## 2021-12-19 DIAGNOSIS — J449 Chronic obstructive pulmonary disease, unspecified: Secondary | ICD-10-CM

## 2021-12-19 MED ORDER — ACEBUTOLOL HCL 200 MG PO CAPS
200.0000 mg | ORAL_CAPSULE | Freq: Three times a day (TID) | ORAL | 3 refills | Status: AC
Start: 2021-12-19 — End: ?

## 2021-12-19 NOTE — Patient Instructions (Signed)
Medication Instructions:  Your physician has recommended you make the following change in your medication:   START: Acebutalol 200 mg three times daily   *If you need a refill on your cardiac medications before your next appointment, please call your pharmacy*   Lab Work: None If you have labs (blood work) drawn today and your tests are completely normal, you will receive your results only by: [place] (if you have MyChart) OR A paper copy in the mail If you have any lab test that is abnormal or we need to change your treatment, we will call you to review the results.   Testing/Procedures: None   Follow-Up: At [facility], you and your health needs are our priority.  As part of our continuing mission to provide you with exceptional heart care, we have created designated Provider Care Teams.  These Care Teams include your primary Cardiologist (physician) and Advanced Practice Providers (APPs -  Physician Assistants and Nurse Practitioners) who all work together to provide you with the care you need, when you need it.  We recommend signing up for the patient portal called "MyChart".  Sign up information is provided on this After Visit Summary.  MyChart is used to connect with patients for Virtual Visits (Telemedicine).  Patients are able to view lab/test results, encounter notes, upcoming appointments, etc.  Non-urgent messages can be sent to your provider as well.   To learn more about what you can do with MyChart, go to [email or web address].    Your next appointment:   6 week(s)  The format for your next appointment:   In Person  Provider:   Shirlee More, MD    Other Instructions Take a salt tablet daily  Have daughter activate heart rate limits, EKG and record symptoms  Check home blood pressure daily and record  Important Information About Sugar

## 2022-02-10 NOTE — Progress Notes (Deleted)
Cardiology Office Note:    Date:  02/10/2022   ID:  Kimberly Phillips, DOB 01-31-1946, MRN [id number]  PCP:  Lilian Coma., MD  Cardiologist:  Shirlee More, MD    Referring MD: Lilian Coma., MD    ASSESSMENT:    No diagnosis found. PLAN:    In order of problems listed above:  ***   Next appointment: ***   Medication Adjustments/Labs and Tests Ordered: Current medicines are reviewed at length with the patient today.  Concerns regarding medicines are outlined above.  No orders of the defined types were placed in this encounter.  No orders of the defined types were placed in this encounter.   No chief complaint on file.   History of Present Illness:    Kimberly Phillips is a 76 y.o. female with a hx of APCs ectopic atrial tachycardia SVT bronchiectasis and hypothyroidism  last seen 07/03/2021. 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 Followed by Cardiology. Formatting of this note might be different from the original. Followed by Cardiology.  Declines anticoagulation.   Attention deficit disorder 12/21/2013   Formatting of this note might be different from the original. IMO routine update Formatting of this note might be different from the original. Overview:  IMO routine update    Benzodiazepine dependence ([place]) 10/21/2018   Bilateral acute serous otitis media 08/11/2016   Blood transfusion without reported diagnosis    Bronchiectasis ([place]) 09/16/2018   Calcification of lung 12/21/2013   Candidal vulvovaginitis 01/25/2020   Carbuncle of finger of right hand 09/26/2014   Chest pain 10/29/2017   Chondromalacia patellae 12/21/2013   Chronic cerebral ischemia 12/21/2013   Chronic headache 12/21/2013   CKD (chronic kidney disease) stage 3, GFR 30-59 ml/min ([place]) 01/02/2021   Colitis 03/20/2016   Community acquired pneumonia 02/28/2015   Contusion of left knee 08/28/2014   COPD (chronic obstructive pulmonary disease) ([place])    Costochondritis 08/20/2016   COVID-19 in immunocompromised patient ([place]) 07/05/2020   Current long-term use of postmenopausal hormone replacement therapy 01/25/2020   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 No Intervention needed- chronic stable problem; intolerant of IVIG in past per EMR - follows pulm (Dr Audery Amel) and saw Rheum in august 2019 (Dr Abner Greenspan with [facility]) Formatting of this note might be different from the original. Patient declines IVIG treatment due to expense   Excessive daytime sleepiness 12/03/2015   Fatigue 10/30/2017   Gastroesophageal reflux disease without esophagitis 12/21/2013   Last Assessment & Plan:  Formatting of this note might be different from the original. 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 IMPRESSION: acephalgic migraine   Intrinsic asthma 03/13/2017   Iris nevus, right 11/12/2016   Iron deficiency anemia 11/29/2014   Irritable bowel syndrome 12/21/2013   Junctional tachycardia ([place]) 07/20/2018   Left ventricular dysfunction 12/21/2013   Leg cramps 10/12/2017   Leg edema, left 02/02/2014   Malaise and fatigue 12/21/2013   Malignant melanoma of torso excluding breast ([place]) 03/25/2017   Memory loss 09/08/2012   Migraine with aura and without status migrainosus, not intractable 10/12/2017   Mild persistent asthma without complication 5/63/8756   Mitral valve prolapse 09/08/2012   Morton's neuroma 12/21/2013   Mounier-Kuhn bronchiectasis with acute exacerbation ([place]) 09/16/2018   Multiple pulmonary nodules determined by computed tomography of lung 12/27/2013   Muscle cramps 01/01/2021   Muscle strain of upper extremity 02/28/2015   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 Chronic oral corticosteroids prescribed by pulmonology for lung disease.   Orthostatic hypotension 12/21/2013   Osteoarthritis, localized, knee 12/21/2013   Osteopenia 02/20/2021   Other nonrheumatic tricuspid valve disorders 12/21/2013   Pain of right lower extremity 01/02/2017   Formatting of this note might be different from the original. Possibly due to electrolyte imbalance vs. Venous insufficiency, patient stands all day at work  - will trial vasculear - check mag and phos - recheck TSH while here   Palpitation 07/20/2018    Paroxysmal A-fib ([place]) 12/29/2013   Formatting of this note might be different from the original. Overview:  Last Assessment & Plan:  Followed by Cardiology.  Last Assessment & Plan:  Followed by Cardiology.  Overview:  Last Assessment & Plan:  Followed by Cardiology.  Last Assessment & Plan:  Formatting of this note might be different from the original. - CHADS2Vasc 3 (female and hx TIA 2018), on ASA only at this time and diltiazem   PAT (paroxysmal atrial tachycardia) ([place]) 09/16/2018   Pernicious anemia 12/29/2013   Phlebitis and thrombophlebitis of superficial vessels of left lower extremity 06/30/2014   Plantar fasciitis, left 02/14/2014   Pleural effusion 12/21/2013   Pleuritis 12/26/2015   Pneumonia    Posterior vitreous detachment, left eye 11/12/2016   Prepatellar bursitis of both knees 07/18/2015   Primary insomnia 01/01/2021   Primary localized osteoarthrosis of ankle and foot 02/14/2014   Restrictive lung disease 12/21/2013   S/P hysterectomy with oophorectomy 11/01/2014   Sepsis due to urinary tract infection ([place]) 11/30/2013   Formatting of this note might be different from the original. IMO routine update   Small intestinal bacterial overgrowth 06/08/2017   Small vessel disease, cerebrovascular 12/21/2013   SOB (shortness of breath) 02/03/2012   Superficial phlebitis 03/20/2016   SVT (supraventricular tachycardia) ([facility])    Symptomatic menopausal or female  climacteric states 12/21/2013   Tachypnea 08/15/2014   TIA (transient ischemic attack)    Tobacco use disorder 12/21/2013   Transient alteration of awareness 12/21/2013   Transient cerebral ischemia 09/08/2012   Traumatic hematoma of left lower leg 03/20/2016   Trochanteric bursitis 12/10/2016   Varicose veins of both lower extremities 01/02/2017   Formatting of this note might be different from the original. Overview:  Chronic, severe, up to hips - will start vasculera as this could be a cause of pain - samples given today - will send to specialty  pharmacy - patient cannot tolerate compression stockings Formatting of this note might be different from the original. Overview:  Chronic, severe, up to hips - will start vasculera as this could b   Vertigo 02/14/2015   Vitreous floater, bilateral 11/12/2016   Weight loss 03/28/2015    Past Surgical History:  Procedure Laterality Date   ABDOMINAL HYSTERECTOMY     APPENDECTOMY     CATARACT EXTRACTION Bilateral    CHOLECYSTECTOMY     TONSILLECTOMY      Current Medications: No outpatient medications have been marked as taking for the 02/11/22 encounter (Appointment) with Richardo Priest, MD.     Allergies:   Codeine, Minocycline, Dermatitis antigen, Gamma globulin [immune globulin], Levofloxacin, Macrolides and ketolides, Moxifloxacin, Oseltamivir, Petrolatum-zinc oxide, Piperacillin-tazobactam in dex, Ciprofloxacin, Liothyronine, Tape, Telbivudine, Troleandomycin, Amoxicillin-pot clavulanate, Atenolol, Nitrofurantoin, and Sulfa antibiotics   Social History   Socioeconomic History   Marital status: Widowed    Spouse name: Not on file   Number of children: Not on file   Years of education: Not on file   Highest education level: Not on file  Occupational History   Not on file  Tobacco Use   Smoking status: Former   Smokeless tobacco: Never  Vaping Use   Vaping Use: Never used  Substance and Sexual Activity   Alcohol use: No   Drug use: No   Sexual activity: Not Currently  Other Topics Concern   Not on file  Social History Narrative   Not on file   Social Determinants of Health   Financial Resource Strain: Not on file  Food Insecurity: Not on file  Transportation Needs: Not on file  Physical Activity: Not on file  Stress: Not on file  Social Connections: Not on file     Family History: The patient's ***family history includes CAD (age of onset: 81) in her father; Cancer in her mother; Diabetes in her maternal grandmother; Heart attack in her father. ROS:   Please see  the history of present illness.    All other systems reviewed and are negative.  EKGs/Labs/Other Studies Reviewed:    The following studies were reviewed today:  EKG:  EKG ordered today and personally reviewed.  The ekg ordered today demonstrates ***  Recent Labs: No results found for requested labs within last 365 days.  Recent Lipid Panel    Component Value Date/Time   CHOL 146 10/30/2017 0318   TRIG 64 10/30/2017 0318   HDL 42 10/30/2017 0318   CHOLHDL 3.5 10/30/2017 0318   VLDL 13 10/30/2017 0318   LDLCALC 91 10/30/2017 0318    Physical Exam:    VS:  There were no vitals taken for this visit.    Wt Readings from Last 3 Encounters:  12/19/21 147 lb 1.3 oz (66.7 kg)  07/03/21 145 lb 1.3 oz (65.8 kg)  05/02/21 150 lb (68 kg)     GEN: *** Well nourished, well developed  in no acute distress HEENT: Normal NECK: No JVD; No carotid bruits LYMPHATICS: No lymphadenopathy CARDIAC: ***RRR, no murmurs, rubs, gallops RESPIRATORY:  Clear to auscultation without rales, wheezing or rhonchi  ABDOMEN: Soft, non-tender, non-distended MUSCULOSKELETAL:  No edema; No deformity  SKIN: Warm and dry NEUROLOGIC:  Alert and oriented x 3 PSYCHIATRIC:  Normal affect    Signed, Shirlee More, MD  02/10/2022 12:39 PM    [place]

## 2022-02-11 ENCOUNTER — Ambulatory Visit: Payer: Medicare Other | Admitting: Cardiology

## 2022-02-12 ENCOUNTER — Telehealth: Payer: Self-pay | Admitting: Cardiology

## 2022-02-12 ENCOUNTER — Ambulatory Visit: Payer: Medicare Other | Admitting: Cardiology

## 2022-02-12 NOTE — Telephone Encounter (Signed)
Spoke with pt and advised that the office was open and I am unsure what office she was at as it could have been the GI office which is one office below and it is closed and the lights are off. Pt states that she knows where she was at and that she was at [address] and it was locked and she became upset and had a terrible time with her heart and had to take extra medication and has been weak all day from it. Offered her an appointment tomorrow but pt declined. Offered her an appointment in [place] but she declined. Appointment made for 02/26/22 in [place].  Dr. Bettina Gavia aware. Will route to [place].

## 2022-02-12 NOTE — Telephone Encounter (Signed)
Left VM to call back 

## 2022-02-12 NOTE — Telephone Encounter (Signed)
Pt states that she had an appt yesterday 07/25 at 1 p.m., but when she got there no one was there. She is very upset because she drove 45 minutes to be there and states that she waited from 12:25 p.m. to 1:15 p.m. and the door was locked. Pt would like a call back for an explanation.

## 2022-02-25 ENCOUNTER — Telehealth: Payer: Self-pay | Admitting: Cardiology

## 2022-02-25 NOTE — Telephone Encounter (Signed)
Patient is requesting a provider switch from Dr. Bettina Gavia to Dr. Stanford Breed due to Dr. Bettina Gavia no longer having availability in [place].

## 2022-02-27 ENCOUNTER — Ambulatory Visit: Payer: Medicare Other | Admitting: Cardiology

## 2022-05-06 ENCOUNTER — Other Ambulatory Visit: Payer: Self-pay | Admitting: Cardiology

## 2022-08-14 NOTE — Progress Notes (Deleted)
HPI: FU PACs and ectopic atrial tachycardic. Previously followed by Dr Bettina Gavia but transitioning to me.  Also seen by Nestor Lewandowsky NP at [facility]. Monitor January 2020 showed sinus rhythm, PVCs, PACs, PAT.  Repeat monitor September 2022 showed sinus rhythm with short runs of SVT longest lasting 12.8 seconds and occasional PVCs.  Echocardiogram at [facility] December 2023 showed normal LV function, mild left atrial enlargement, mild aortic insufficiency, mild mitral regurgitation.  Nuclear study at [facility] January 2024 showed ejection fraction 69% and no ischemia.  Recently seen at [facility] and placed on flecainide.  Since last seen,   Current Outpatient Medications  Medication Sig Dispense Refill   acebutolol (SECTRAL) 200 MG capsule Take 1 capsule (200 mg total) by mouth in the morning, at noon, and at bedtime. 180 capsule 3   Calcium Carb-Cholecalciferol (CALCIUM 600-D PO) Take 1,200 mg by mouth daily with lunch.     diltiazem (CARDIZEM) 30 MG tablet TAKE 1 TABLET BY MOUTH 3 TIMES DAILY 270 tablet 3   estradiol (ESTRACE) 0.5 MG tablet Take 0.5 mg by mouth daily with lunch.      levothyroxine (SYNTHROID) 112 MCG tablet Take 112 mcg by mouth every morning.     magnesium oxide (MAG-OX) 400 MG tablet Take 400 mg by mouth at bedtime.     predniSONE (DELTASONE) 5 MG tablet Take 5 mg by mouth daily.     tiZANidine (ZANAFLEX) 4 MG tablet Take 1 tablet (4 mg total) by mouth at bedtime. 30 tablet 0   traZODone (DESYREL) 100 MG tablet Take 1 tablet by mouth every evening.     ZINC SULFATE PO Take 1 tablet by mouth daily.     No current facility-administered medications for this visit.     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 Followed by Cardiology. Formatting of this note might be different from the original. Followed by Cardiology.  Declines anticoagulation.   Attention deficit disorder 12/21/2013   Formatting of this note might be different from the original. IMO routine update Formatting of this note might be different from the original. Overview:  IMO routine update   Benzodiazepine dependence ([place]) 10/21/2018   Bilateral acute serous otitis media 08/11/2016   Blood transfusion without reported diagnosis    Bronchiectasis ([place]) 09/16/2018   Calcification of lung 12/21/2013   Candidal vulvovaginitis 01/25/2020   Carbuncle of finger of right hand 09/26/2014   Chest pain 10/29/2017   Chondromalacia patellae 12/21/2013   Chronic cerebral ischemia 12/21/2013   Chronic headache 12/21/2013   CKD (chronic kidney disease) stage 3, GFR 30-59 ml/min ([place]) 01/02/2021   Colitis 03/20/2016   Community acquired pneumonia 02/28/2015   Contusion of left knee 08/28/2014   COPD (chronic obstructive pulmonary disease) ([place])    Costochondritis 08/20/2016   COVID-19 in immunocompromised patient ([place]) 07/05/2020   Current long-term use  of postmenopausal hormone replacement therapy 01/25/2020   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 No Intervention needed- chronic stable problem; intolerant of IVIG in past per EMR - follows pulm (Dr Audery Amel) and saw Rheum in august 2019 (Dr Abner Greenspan with [facility]) Formatting of this note might be different from the original. Patient declines IVIG  treatment due to expense   Excessive daytime sleepiness 12/03/2015   Fatigue 10/30/2017   Gastroesophageal reflux disease without esophagitis 12/21/2013   Last Assessment & Plan:  Formatting of this note might be different from the original. 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 IMPRESSION: acephalgic migraine   Intrinsic asthma 03/13/2017   Iris nevus, right 11/12/2016   Iron deficiency anemia 11/29/2014   Irritable bowel syndrome 12/21/2013   Junctional tachycardia ([place]) 07/20/2018   Left ventricular dysfunction 12/21/2013   Leg cramps 10/12/2017   Leg edema, left 02/02/2014   Malaise and fatigue 12/21/2013   Malignant melanoma of torso excluding breast ([place]) 03/25/2017   Memory loss 09/08/2012   Migraine with aura and without status migrainosus, not intractable 10/12/2017   Mild persistent asthma without complication 7/90/3833   Mitral valve prolapse 09/08/2012   Morton's neuroma 12/21/2013   Mounier-Kuhn bronchiectasis with acute exacerbation ([place]) 09/16/2018   Multiple pulmonary nodules determined by computed tomography of lung 12/27/2013   Muscle cramps 01/01/2021   Muscle strain of upper extremity 02/28/2015   Mycobacterium avium-intracellulare  complex ([place]) 09/08/2012   Nausea & vomiting 03/28/2015   Need for hepatitis C screening test 08/11/2016   On prednisone therapy 01/01/2021   Formatting of this note might be different from the original. Chronic oral corticosteroids prescribed by pulmonology for lung disease.   Orthostatic hypotension 12/21/2013   Osteoarthritis, localized, knee 12/21/2013   Osteopenia 02/20/2021   Other nonrheumatic tricuspid valve disorders 12/21/2013   Pain of right lower extremity 01/02/2017   Formatting of this note might be different from the original. Possibly due to electrolyte imbalance vs. Venous insufficiency, patient stands all day at work  - will trial vasculear - check mag and phos - recheck TSH while here   Palpitation 07/20/2018   Paroxysmal A-fib ([place]) 12/29/2013   Formatting of this note might be different from the original. Overview:  Last Assessment & Plan:  Followed by Cardiology.  Last Assessment & Plan:  Followed by Cardiology.  Overview:  Last Assessment & Plan:  Followed by Cardiology.  Last Assessment & Plan:  Formatting of this note might be different from the original. - CHADS2Vasc 3 (female and hx TIA 2018), on ASA only at this time and diltiazem   PAT (paroxysmal atrial tachycardia) ([place]) 09/16/2018   Pernicious anemia  12/29/2013   Phlebitis and thrombophlebitis of superficial vessels of left lower extremity 06/30/2014   Plantar fasciitis, left 02/14/2014   Pleural effusion 12/21/2013   Pleuritis 12/26/2015   Pneumonia    Posterior vitreous detachment, left eye 11/12/2016   Prepatellar bursitis of both knees 07/18/2015   Primary insomnia 01/01/2021   Primary localized osteoarthrosis of ankle and foot 02/14/2014   Restrictive lung disease 12/21/2013   S/P hysterectomy with oophorectomy 11/01/2014   Sepsis due to urinary tract infection ([place]) 11/30/2013   Formatting of this note might be different from the original. IMO routine update   Small intestinal bacterial overgrowth 06/08/2017   Small vessel  disease, cerebrovascular 12/21/2013   SOB (shortness of breath) 02/03/2012   Superficial phlebitis 03/20/2016   SVT (supraventricular tachycardia) ([place])    Symptomatic menopausal or female climacteric states 12/21/2013   Tachypnea 08/15/2014   TIA (transient ischemic attack)    Tobacco use disorder 12/21/2013   Transient alteration of awareness 12/21/2013   Transient cerebral ischemia 09/08/2012   Traumatic hematoma of left lower leg 03/20/2016   Trochanteric bursitis 12/10/2016   Varicose veins of both lower extremities 01/02/2017   Formatting of this note might be different from the original. Overview:  Chronic, severe, up to hips - will start vasculera as this could be a cause of pain - samples given today - will send to specialty pharmacy - patient cannot tolerate compression stockings Formatting of this note might be different from the original. Overview:  Chronic, severe, up to hips - will start vasculera as this could b   Vertigo 02/14/2015   Vitreous floater, bilateral 11/12/2016   Weight loss 03/28/2015    Past Surgical History:  Procedure Laterality Date   ABDOMINAL HYSTERECTOMY     APPENDECTOMY     CATARACT EXTRACTION Bilateral    CHOLECYSTECTOMY     TONSILLECTOMY      Social History   Socioeconomic History   Marital status: Widowed    Spouse name: Not on file   Number of children: Not on file   Years of education: Not on file   Highest education level: Not on file  Occupational History   Not on file  Tobacco Use   Smoking status: Former   Smokeless tobacco: Never  Vaping Use   Vaping Use: Never used  Substance and Sexual Activity   Alcohol use: No   Drug use: No   Sexual activity: Not Currently  Other Topics Concern   Not on file  Social History Narrative   Not on file   Social Determinants of Health   Financial Resource Strain: Not on file  Food Insecurity: Not on file  Transportation Needs: Not on file  Physical Activity: Not on file  Stress: Not on file  Social  Connections: Not on file  Intimate Partner Violence: Not on file    Family History  Problem Relation Age of Onset   CAD Father 40       multiple MI's, s/p CABG; deceased in 24s   Heart attack Father    Cancer Mother    Diabetes Maternal Grandmother     ROS: no fevers or chills, productive cough, hemoptysis, dysphasia, odynophagia, melena, hematochezia, dysuria, hematuria, rash, seizure activity, orthopnea, PND, pedal edema, claudication. Remaining systems are negative.  Physical Exam: Well-developed well-nourished in no acute distress.  Skin is warm and dry.  HEENT is normal.  Neck is supple.  Chest is clear to auscultation with normal expansion.  Cardiovascular exam is regular  rate and rhythm.  Abdominal exam nontender or distended. No masses palpated. Extremities show no edema. neuro grossly intact  ECG- personally reviewed  A/P  1 PACs/atrial tachycardia-  2 bronchiectasis/COPD-  Kirk Ruths, MD

## 2022-08-27 ENCOUNTER — Ambulatory Visit: Payer: Medicare Other | Admitting: Cardiology
